# Patient Record
Sex: Male | Born: 1950
Health system: Southern US, Community
[De-identification: ages and names within clinical notes are randomized; demographics above are authoritative.]

## PROBLEM LIST (undated history)

## (undated) DIAGNOSIS — L570 Actinic keratosis: Secondary | ICD-10-CM

## (undated) DIAGNOSIS — C4491 Basal cell carcinoma of skin, unspecified: Secondary | ICD-10-CM

## (undated) DIAGNOSIS — K219 Gastro-esophageal reflux disease without esophagitis: Secondary | ICD-10-CM

## (undated) DIAGNOSIS — I480 Paroxysmal atrial fibrillation: Secondary | ICD-10-CM

## (undated) DIAGNOSIS — N200 Calculus of kidney: Secondary | ICD-10-CM

## (undated) DIAGNOSIS — E785 Hyperlipidemia, unspecified: Secondary | ICD-10-CM

## (undated) HISTORY — DX: Basal cell carcinoma of skin, unspecified: C44.91

## (undated) HISTORY — DX: Actinic keratosis: L57.0

---

## 2013-02-07 ENCOUNTER — Emergency Department: Payer: Self-pay | Admitting: Emergency Medicine

## 2013-02-07 LAB — BASIC METABOLIC PANEL
Chloride: 105 mmol/L (ref 98–107)
Co2: 29 mmol/L (ref 21–32)
Osmolality: 285 (ref 275–301)
Potassium: 4.3 mmol/L (ref 3.5–5.1)

## 2013-02-07 LAB — URINALYSIS, COMPLETE
Ketone: NEGATIVE
Leukocyte Esterase: NEGATIVE
Nitrite: NEGATIVE
Protein: 100
RBC,UR: 356 /HPF (ref 0–5)
WBC UR: 3 /HPF (ref 0–5)

## 2013-02-07 LAB — CBC
HCT: 41.9 % (ref 40.0–52.0)
MCH: 29.1 pg (ref 26.0–34.0)
MCHC: 34.5 g/dL (ref 32.0–36.0)
MCV: 84 fL (ref 80–100)
RBC: 4.96 10*6/uL (ref 4.40–5.90)
RDW: 12.9 % (ref 11.5–14.5)

## 2013-02-07 LAB — CK TOTAL AND CKMB (NOT AT ARMC): CK, Total: 77 U/L (ref 35–232)

## 2013-02-07 LAB — TROPONIN I: Troponin-I: <0.02 ng/mL

## 2015-05-28 ENCOUNTER — Inpatient Hospital Stay
Admission: EM | Admit: 2015-05-28 | Discharge: 2015-05-29 | DRG: 310 | Disposition: A | Discharge status: Home / Self Care | Payer: Medicare Other | Attending: Internal Medicine | Admitting: Internal Medicine

## 2015-05-28 ENCOUNTER — Emergency Department: Payer: Medicare Other

## 2015-05-28 ENCOUNTER — Encounter: Payer: Self-pay | Admitting: Emergency Medicine

## 2015-05-28 DIAGNOSIS — Z8249 Family history of ischemic heart disease and other diseases of the circulatory system: Secondary | ICD-10-CM

## 2015-05-28 DIAGNOSIS — I4891 Unspecified atrial fibrillation: Secondary | ICD-10-CM | POA: Diagnosis not present

## 2015-05-28 DIAGNOSIS — R079 Chest pain, unspecified: Secondary | ICD-10-CM | POA: Diagnosis present

## 2015-05-28 DIAGNOSIS — Z7982 Long term (current) use of aspirin: Secondary | ICD-10-CM | POA: Diagnosis not present

## 2015-05-28 DIAGNOSIS — E785 Hyperlipidemia, unspecified: Secondary | ICD-10-CM | POA: Diagnosis present

## 2015-05-28 DIAGNOSIS — M25512 Pain in left shoulder: Secondary | ICD-10-CM | POA: Diagnosis present

## 2015-05-28 DIAGNOSIS — Z79899 Other long term (current) drug therapy: Secondary | ICD-10-CM | POA: Diagnosis not present

## 2015-05-28 LAB — COMPREHENSIVE METABOLIC PANEL
ALBUMIN: 4.3 g/dL (ref 3.5–5.0)
ALK PHOS: 59 U/L (ref 38–126)
ALT: 40 U/L (ref 17–63)
AST: 17 U/L (ref 15–41)
Anion gap: 7 (ref 5–15)
BILIRUBIN TOTAL: 0.8 mg/dL (ref 0.3–1.2)
BUN: 22 mg/dL — AB (ref 6–20)
CALCIUM: 9.5 mg/dL (ref 8.9–10.3)
CO2: 27 mmol/L (ref 22–32)
Chloride: 105 mmol/L (ref 101–111)
Creatinine, Ser: 0.83 mg/dL (ref 0.61–1.24)
GFR calc Af Amer: >60 mL/min (ref >60)
GFR calc non Af Amer: >60 mL/min (ref >60)
GLUCOSE: 148 mg/dL — AB (ref 65–99)
Potassium: 4 mmol/L (ref 3.5–5.1)
Sodium: 139 mmol/L (ref 135–145)
TOTAL PROTEIN: 7.8 g/dL (ref 6.5–8.1)

## 2015-05-28 LAB — PROTIME-INR
INR: 0.96
Prothrombin Time: 13 seconds (ref 11.4–15.0)

## 2015-05-28 LAB — CBC
HCT: 49.5 % (ref 40.0–52.0)
HEMOGLOBIN: 16.5 g/dL (ref 13.0–18.0)
MCH: 28.4 pg (ref 26.0–34.0)
MCHC: 33.3 g/dL (ref 32.0–36.0)
MCV: 85.2 fL (ref 80.0–100.0)
PLATELETS: 188 10*3/uL (ref 150–440)
RBC: 5.8 MIL/uL (ref 4.40–5.90)
RDW: 13.2 % (ref 11.5–14.5)
WBC: 7.9 10*3/uL (ref 3.8–10.6)

## 2015-05-28 LAB — LIPID PANEL
CHOLESTEROL: 195 mg/dL (ref 0–200)
HDL: 47 mg/dL (ref >40)
LDL Cholesterol: 107 mg/dL — ABNORMAL HIGH (ref 0–99)
TRIGLYCERIDES: 205 mg/dL — AB (ref <150)
Total CHOL/HDL Ratio: 4.1 RATIO
VLDL: 41 mg/dL — ABNORMAL HIGH (ref 0–40)

## 2015-05-28 LAB — TROPONIN I
Troponin I: <0.03 ng/mL (ref <0.031)
Troponin I: 0.03 ng/mL (ref <0.031)
Troponin I: 0.03 ng/mL (ref <0.031)

## 2015-05-28 LAB — HEMOGLOBIN A1C: Hgb A1c MFr Bld: 6.1 % — ABNORMAL HIGH (ref 4.0–6.0)

## 2015-05-28 LAB — APTT: aPTT: 26 seconds (ref 24–36)

## 2015-05-28 LAB — TSH: TSH: 1.078 u[IU]/mL (ref 0.350–4.500)

## 2015-05-28 MED ORDER — DILTIAZEM HCL 25 MG/5ML IV SOLN
10.0000 mg | Freq: Once | INTRAVENOUS | Status: AC
  Administered 2015-05-28: 10 mg via INTRAVENOUS

## 2015-05-28 MED ORDER — HEPARIN SODIUM (PORCINE) 5000 UNIT/ML IJ SOLN: 4000.0000 [IU] | Freq: Once | INTRAMUSCULAR | Status: DC

## 2015-05-28 MED ORDER — DILTIAZEM HCL 25 MG/5ML IV SOLN
INTRAVENOUS | Status: AC
  Filled 2015-05-28: qty 5

## 2015-05-28 MED ORDER — DILTIAZEM LOAD VIA INFUSION
10.0000 mg | Freq: Once | INTRAVENOUS | Status: AC
  Administered 2015-05-28: 10 mg via INTRAVENOUS
  Filled 2015-05-28: qty 10

## 2015-05-28 MED ORDER — NITROGLYCERIN 0.4 MG SL SUBL
0.4000 mg | SUBLINGUAL_TABLET | SUBLINGUAL | Status: DC | PRN
Start: 2015-05-28 — End: 2015-05-29

## 2015-05-28 MED ORDER — ASPIRIN 81 MG PO CHEW
324.0000 mg | CHEWABLE_TABLET | Freq: Once | ORAL | Status: AC
Start: 2015-05-28 — End: 2015-05-28
  Administered 2015-05-28: 324 mg via ORAL
  Filled 2015-05-28: qty 4

## 2015-05-28 MED ORDER — HEPARIN (PORCINE) IN NACL 100-0.45 UNIT/ML-% IJ SOLN
1100.0000 [IU]/h | INTRAMUSCULAR | Status: DC
  Filled 2015-05-28: qty 250

## 2015-05-28 MED ORDER — SODIUM CHLORIDE 0.9 % IV SOLN
Freq: Once | INTRAVENOUS | Status: AC
  Administered 2015-05-28: 10:00:00 via INTRAVENOUS

## 2015-05-28 MED ORDER — DILTIAZEM HCL 30 MG PO TABS
60.0000 mg | ORAL_TABLET | Freq: Once | ORAL | Status: AC
  Administered 2015-05-28: 60 mg via ORAL

## 2015-05-28 MED ORDER — MELOXICAM 7.5 MG PO TABS: 15.0000 mg | ORAL_TABLET | Freq: Every day | ORAL | Status: DC

## 2015-05-28 MED ORDER — ASPIRIN EC 81 MG PO TBEC
81.0000 mg | DELAYED_RELEASE_TABLET | ORAL | Status: DC
  Filled 2015-05-28: qty 1

## 2015-05-28 MED ORDER — LORATADINE 10 MG PO TABS
10.0000 mg | ORAL_TABLET | Freq: Every day | ORAL | Status: DC
  Administered 2015-05-29: 10 mg via ORAL
  Filled 2015-05-28: qty 1

## 2015-05-28 MED ORDER — RIVAROXABAN 20 MG PO TABS
20.0000 mg | ORAL_TABLET | Freq: Once | ORAL | Status: AC
  Administered 2015-05-29: 20 mg via ORAL
  Filled 2015-05-28 (×2): qty 1

## 2015-05-28 MED ORDER — DILTIAZEM HCL 60 MG PO TABS
60.0000 mg | ORAL_TABLET | Freq: Three times a day (TID) | ORAL | Status: DC
  Administered 2015-05-28: 60 mg via ORAL
  Filled 2015-05-28 (×2): qty 2
  Filled 2015-05-28: qty 1

## 2015-05-28 MED ORDER — DILTIAZEM HCL 25 MG/5ML IV SOLN
20.0000 mg | Freq: Once | INTRAVENOUS | Status: AC
  Administered 2015-05-28: 20 mg via INTRAVENOUS
  Filled 2015-05-28: qty 5

## 2015-05-28 MED ORDER — DILTIAZEM HCL 25 MG/5ML IV SOLN
INTRAVENOUS | Status: AC
  Administered 2015-05-28: 10 mg via INTRAVENOUS
  Filled 2015-05-28: qty 5

## 2015-05-28 MED ORDER — DILTIAZEM HCL 30 MG PO TABS
30.0000 mg | ORAL_TABLET | Freq: Once | ORAL | Status: AC
  Administered 2015-05-28: 30 mg via ORAL
  Filled 2015-05-28: qty 1

## 2015-05-28 MED ORDER — RIVAROXABAN 15 MG PO TABS
15.0000 mg | ORAL_TABLET | Freq: Once | ORAL | Status: AC
  Administered 2015-05-28: 15 mg via ORAL
  Filled 2015-05-28: qty 1

## 2015-05-28 MED ORDER — HEPARIN (PORCINE) IN NACL 100-0.45 UNIT/ML-% IJ SOLN: 12.0000 [IU]/kg/h | Freq: Once | INTRAMUSCULAR | Status: DC

## 2015-05-28 MED ORDER — HEPARIN BOLUS VIA INFUSION
4000.0000 [IU] | Freq: Once | INTRAVENOUS | Status: DC
  Filled 2015-05-28: qty 4000

## 2015-05-28 MED ORDER — DEXTROSE 5 % IV SOLN
5.0000 mg/h | INTRAVENOUS | Status: DC
  Administered 2015-05-28: 10 mg/h via INTRAVENOUS
  Filled 2015-05-28 (×2): qty 100

## 2015-05-28 NOTE — Progress Notes (Signed)
HR continue to fluctuate- even after second cardizem injection and oral tablet- will start on IV drip and monitor on stepdown unit.  BP stable, Does not appear in distress. No c/o chest pain.  Critical due to need for IV rate controlling meds, discussed with family. Monitor in stepdown.  Additional critical care time spent 20 min.

## 2015-05-28 NOTE — H&P (Signed)
[facility] [place] NAME: Anthony Saunders    MR#:  [id number]  DATE OF BIRTH:  1951-07-01  DATE OF ADMISSION:  05/28/2015  PRIMARY CARE PHYSICIAN: Madelyn Brunner, MD   REQUESTING/REFERRING PHYSICIAN: Dr. Jimmye Norman  CHIEF COMPLAINT:   Chief Complaint  Patient presents with  . Irregular Heart Beat    HISTORY OF PRESENT ILLNESS: Anthony Saunders  is a 64 y.o. male with a known history of no regular medical issues, follows with his PMD regularly, and just on some pain medication for his pain in his left shoulder at home. Today when he was at work when he was trying to lift up a door and loaded up on the truck with his partner he suddenly started having palpitation in the chest, along with that he had heaviness in his chest and excessive sweating on his forehead. Concerned with this he called his daughter who is a Equities trader, she came with her blood pressure cough to check his blood pressure, which was 150/100 but the heart rate was in the 80s and 90s on the machine, when she felt his was she felt it being very irregular so she told him to come to emergency room and just brought him here. Patient denies any similar episodes in the past, he said that once in the remote past history-this was done in the arm and it was negative. In ER he was noted to have atrial fibrillation with ventricular response up to 140 and 150, given Cardizem injection 30 mg once which helped to slow it down to 90s and 100- and hospitalist team was contacted for further management. When I came to see the patient his heart rate is again fluctuating between 100-140, but patient denies any chest pain at this time after receiving nitroglycerin and aspirin tablets by ER.   PAST MEDICAL HISTORY:  History reviewed. No pertinent past medical history.  PAST SURGICAL HISTORY: History reviewed. No pertinent past surgical history.  SOCIAL HISTORY:  Social History  Substance Use  Topics  . Smoking status: Never Smoker   . Smokeless tobacco: Not on file  . Alcohol Use: No    FAMILY HISTORY:  Family History  Problem Relation Age of Onset  . CAD Father     DRUG ALLERGIES: No Known Allergies  REVIEW OF SYSTEMS:   CONSTITUTIONAL: No fever, fatigue or weakness.  EYES: No blurred or double vision.  EARS, NOSE, AND THROAT: No tinnitus or ear pain.  RESPIRATORY: No cough, shortness of breath, wheezing or hemoptysis.  CARDIOVASCULAR:  positive for chest  pain, palpitation , no  orthopnea, edema.  GASTROINTESTINAL: No nausea, vomiting, diarrhea or abdominal pain.  GENITOURINARY: No dysuria, hematuria.  ENDOCRINE: No polyuria, nocturia,  HEMATOLOGY: No anemia, easy bruising or bleeding SKIN: No rash or lesion. MUSCULOSKELETAL: No joint pain or arthritis.   NEUROLOGIC: No tingling, numbness, weakness.  PSYCHIATRY: No anxiety or depression.   MEDICATIONS AT HOME:  Prior to Admission medications   Medication Sig Start Date End Date Taking? Authorizing Provider  aspirin EC 81 MG tablet Take 81 mg by mouth every other day. At bedtime   Yes Historical Provider, MD  cetirizine (ZYRTEC) 10 MG tablet Take 10 mg by mouth at bedtime.   Yes Historical Provider, MD  meloxicam (MOBIC) 15 MG tablet Take 15 mg by mouth at bedtime.   Yes Historical Provider, MD      PHYSICAL EXAMINATION:   VITAL SIGNS: Blood pressure 136/79, pulse  68, resp. rate 20, height 5\' 6"  (1.676 m), weight 78.472 kg (173 lb), SpO2 95 %.  GENERAL:  64 y.o.-year-old patient lying in the bed with no acute distress.  EYES: Pupils equal, round, reactive to light and accommodation. No scleral icterus. Extraocular muscles intact.  HEENT: Head atraumatic, normocephalic. Oropharynx and nasopharynx clear.  NECK:  Supple, no jugular venous distention. No thyroid enlargement, no tenderness.  LUNGS: Normal breath sounds bilaterally, no wheezing, rales,rhonchi or crepitation. No use of accessory muscles of  respiration.  CARDIOVASCULAR: S1, S2 rapid and irregular . No murmurs, rubs, or gallops.  ABDOMEN: Soft, nontender, nondistended. Bowel sounds present. No organomegaly or mass.  EXTREMITIES: No pedal edema, cyanosis, or clubbing.  NEUROLOGIC: Cranial nerves II through XII are intact. Muscle strength 5/5 in all extremities. Sensation intact. Gait not checked.  PSYCHIATRIC: The patient is alert and oriented x 3.  SKIN: No obvious rash, lesion, or ulcer.   LABORATORY PANEL:   CBC  Recent Labs Lab 05/28/15 0947  WBC 7.9  HGB 16.5  HCT 49.5  PLT 188  MCV 85.2  MCH 28.4  MCHC 33.3  RDW 13.2   ------------------------------------------------------------------------------------------------------------------  Chemistries   Recent Labs Lab 05/28/15 0947  NA 139  K 4.0  CL 105  CO2 27  GLUCOSE 148*  BUN 22*  CREATININE 0.83  CALCIUM 9.5  AST 17  ALT 40  ALKPHOS 59  BILITOT 0.8   ------------------------------------------------------------------------------------------------------------------ estimated creatinine clearance is 87.5 mL/min (by C-G formula based on Cr of 0.83). ------------------------------------------------------------------------------------------------------------------  Recent Labs  05/28/15 0947  TSH 1.078     Coagulation profile  Recent Labs Lab 05/28/15 0947  INR 0.96   ------------------------------------------------------------------------------------------------------------------- No results for input(s): DDIMER in the last 72 hours. -------------------------------------------------------------------------------------------------------------------  Cardiac Enzymes  Recent Labs Lab 05/28/15 0947  TROPONINI <0.03   ------------------------------------------------------------------------------------------------------------------ Invalid input(s):  POCBNP  ---------------------------------------------------------------------------------------------------------------  Urinalysis    Component Value Date/Time   COLORURINE Yellow 02/07/2013 0142   APPEARANCEUR Hazy 02/07/2013 0142   LABSPEC 1.021 02/07/2013 0142   PHURINE 5.0 02/07/2013 0142   GLUCOSEU Negative 02/07/2013 0142   HGBUR 3+ 02/07/2013 0142   BILIRUBINUR Negative 02/07/2013 0142   KETONESUR Negative 02/07/2013 0142   PROTEINUR 100 mg/dL 02/07/2013 0142   NITRITE Negative 02/07/2013 0142   LEUKOCYTESUR Negative 02/07/2013 0142     RADIOLOGY: Dg Chest Port 1 View  05/28/2015  CLINICAL DATA:  Left-sided chest pain following moving a door, initial encounter EXAM: PORTABLE CHEST - 1 VIEW COMPARISON:  None. FINDINGS: The heart size and mediastinal contours are within normal limits. Both lungs are clear. The visualized skeletal structures are unremarkable. IMPRESSION: No active disease. Electronically Signed   By: Inez Catalina M.D.   On: 05/28/2015 10:27    EKG:   attempted fibrillation with ventricular response up to 130.  Assessment and Plan  * Atrial Fbrillation with rapid ventricular response   Given injection Cardizem 30 mg once with short lasting response.  Now again heart rate is fast, we'll give her 10 mg Cardizem injection 1 time and oral Cardizem.  Monitor 1 more hour in ER if his heart rate stays under control after that we'll send him to telemetry floor, otherwise started on Cardizem drip and we'll have to send him to stepdown unit.    I will check lipid panel, hemoglobin A1c, echocardiogram, serial troponin   Get cardiology consultation with the [facility] oncall.   TSH normal.   Cardiology to decide his need for anti-coagulation.  Given 1  dose of Xarelto by ER.  * Chest pain  This was with episode of palpitation, resolved with nitroglycerin.  He will be monitored, serial troponin and echocardiogram will be done.  * Left shoulder pain  Continue  meloxicam.   All the records are reviewed and case discussed with ED provider. Management plans discussed with the patient, family and they are in agreement.  CODE STATUS: Full    TOTAL TIME TAKING CARE OF THIS PATIENT:50 min.   Vaughan Basta M.D on 05/28/2015   Between 7am to 6pm - Pager - [phone]  After 6pm go to [email or web address] - password [email or web address] [facility]  Office  [phone]  CC: Primary care physician; Madelyn Brunner, MD   Note: This dictation was prepared with Dragon dictation along with smaller phrase technology. Any transcriptional errors that result from this process are unintentional.

## 2015-05-28 NOTE — ED Notes (Signed)
Dr. Laureen Abrahams called and notified of uncontrolled heart rate.  Admission orders to be changed and patient to be started on Cardizem drip.

## 2015-05-28 NOTE — Progress Notes (Signed)
ANTICOAGULATION CONSULT NOTE - Initial Consult  Pharmacy Consult for Heparin Drip initiation and monitoring Indication: atrial fibrillation  No Known Allergies  Patient Measurements: Height: 5\' 6"  (167.6 cm) Weight: 173 lb (78.472 kg) IBW/kg (Calculated) : 63.8   Vital Signs: BP: 118/85 mmHg (11/16 1056) Pulse Rate: 108 (11/16 1056)  Labs:  Recent Labs  05/28/15 0947  HGB 16.5  HCT 49.5  PLT 188  APTT 26  LABPROT 13.0  INR 0.96  CREATININE 0.83  TROPONINI <0.03    Estimated Creatinine Clearance: 87.5 mL/min (by C-G formula based on Cr of 0.83).   Medical History: History reviewed. No pertinent past medical history.  Medications:  Scheduled:  . diltiazem  30 mg Oral Once  . heparin  4,000 Units Intravenous Once  . Rivaroxaban  15 mg Oral Once   Infusions:    Assessment: Pharmacy consulted to initiate and monitor heparin drip for 64 yo male admitted with chest pain and rapid Afib.    Goal of Therapy:  Heparin level 0.3-0.7 units/ml Monitor platelets by anticoagulation protocol: Yes   Plan:  Will initiate patient on heparin 1100 units/ hr with initial bolus of 4000 units.   Will obtain Anti-Xa level at 1730.    Pharmacy will continue to monitor and adjust per consult.     Simpson,Michael L 05/28/2015,11:22 AM

## 2015-05-28 NOTE — ED Notes (Signed)
Patient has chest pain since lifting a door at work.  But also had heart racing.  Still has some chest pain.

## 2015-05-28 NOTE — ED Provider Notes (Signed)
[facility] Emergency Department Provider Note     Time seen: ----------------------------------------- 9:46 AM on 05/28/2015 -----------------------------------------    I have reviewed the triage vital signs and the nursing notes.   HISTORY  Chief Complaint Irregular Heart Beat    HPI Anthony Saunders is a 64 y.o. male who presents ER for chest pain that started left side this morning while moving a door then proceeded to cut wood thereafter. Patient was having chest pain that persisted, describes soreness across his chest, he was noted have fast heart rate in triage. Patient denies any fevers, chills, shortness of breath. Pain is referred in his left arm, denies any nausea or other symptoms. Patient is not felt this way before.   History reviewed. No pertinent past medical history.  There are no active problems to display for this patient.   History reviewed. No pertinent past surgical history.  Allergies Review of patient's allergies indicates no known allergies.  Social History Social History  Substance Use Topics  . Smoking status: Never Smoker   . Smokeless tobacco: None  . Alcohol Use: No    Review of Systems Constitutional: Negative for fever. Eyes: Negative for visual changes. ENT: Negative for sore throat. Cardiovascular: Positive for chest pain Respiratory: Negative for shortness of breath. Gastrointestinal: Negative for abdominal pain, vomiting and diarrhea. Genitourinary: Negative for dysuria. Musculoskeletal: Negative for back pain. Skin: Negative for rash. Neurological: Negative for headaches, focal weakness or numbness.  10-point ROS otherwise negative.  ____________________________________________   PHYSICAL EXAM:  VITAL SIGNS: ED Triage Vitals  Enc Vitals Group     BP 05/28/15 0944 153/88 mmHg     Pulse Rate 05/28/15 0944 143     Resp 05/28/15 0944 20     Temp --      Temp src --      SpO2 05/28/15 0944 99  %     Weight 05/28/15 0944 173 lb (78.472 kg)     Height 05/28/15 0944 5\' 6"  (1.676 m)     Head Cir --      Peak Flow --      Pain Score 05/28/15 0934 5     Pain Loc --      Pain Edu? --      Excl. in [place]? --     Constitutional: Alert and oriented. Well appearing and in no distress. Eyes: Conjunctivae are normal. PERRL. Normal extraocular movements. ENT   Head: Normocephalic and atraumatic.   Nose: No congestion/rhinnorhea.   Mouth/Throat: Mucous membranes are moist.   Neck: No stridor. Cardiovascular: Irregularly irregular rhythm. Normal and symmetric distal pulses are present in all extremities.  Respiratory: Normal respiratory effort without tachypnea nor retractions. Breath sounds are clear and equal bilaterally. No wheezes/rales/rhonchi. Gastrointestinal: Soft and nontender. No distention. No abdominal bruits.  Musculoskeletal: Nontender with normal range of motion in all extremities. No joint effusions.  No lower extremity tenderness nor edema. Neurologic:  Normal speech and language. No gross focal neurologic deficits are appreciated. Speech is normal. No gait instability. Skin:  Skin is warm, dry and intact. No rash noted. Psychiatric: Mood and affect are normal. Speech and behavior are normal. Patient exhibits appropriate insight and judgment. ____________________________________________  EKG: Interpreted by me. Atrial fibrillation with a rapid ventricular response. Rate is 136 bpm. Nonspecific ST and T wave changes, question will septal infarct.  ____________________________________________  ED COURSE:  Pertinent labs & imaging results that were available during my care of the patient were reviewed by me  and considered in my medical decision making (see chart for details). Patient is in no acute distress, will receive IV Cardizem for rate control. Aspirin will be given as well for his chest pain. ____________________________________________    LABS  (pertinent positives/negatives)  Labs Reviewed  COMPREHENSIVE METABOLIC PANEL - Abnormal; Notable for the following:    Glucose, Bld 148 (*)    BUN 22 (*)    All other components within normal limits  CBC  TROPONIN I  APTT  PROTIME-INR  TSH   CRITICAL CARE Performed by: Earleen Newport   Total critical care time: 30 minutes  Critical care time was exclusive of separately billable procedures and treating other patients.  Critical care was necessary to treat or prevent imminent or life-threatening deterioration.  Critical care was time spent personally by me on the following activities: development of treatment plan with patient and/or surrogate as well as nursing, discussions with consultants, evaluation of patient's response to treatment, examination of patient, obtaining history from patient or surrogate, ordering and performing treatments and interventions, ordering and review of laboratory studies, ordering and review of radiographic studies, pulse oximetry and re-evaluation of patient's condition.    RADIOLOGY Images were viewed by me  Chest x-ray  IMPRESSION: No active disease. ____________________________________________  FINAL ASSESSMENT AND PLAN  Chest pain, rapid atrial fibrillation  Plan: Patient with labs and imaging as dictated above. Patient received IV and oral Cardizem with good rate control. We'll place on a heparin drip as well due to new onset A. fib associated with chest pain. He is currently chest pain-free, stable for admission.   Earleen Newport, MD   Earleen Newport, MD 05/28/15 [phone]

## 2015-05-28 NOTE — ED Notes (Signed)
Patient is resting comfortably. Family at [facility] [place] updated

## 2015-05-28 NOTE — ED Notes (Addendum)
Dr. Boyce Medici notified of controlled heart rate since Cardizem drip started at 14:05.  Ordered received for po Cardizem to be given and if heart rate remains controlled turn drip off in one hour patient may go to 2A.

## 2015-05-28 NOTE — Consult Note (Addendum)
[place] [facility]  CARDIOLOGY CONSULT NOTE  Patient ID: Anthony Saunders MRN: [id number] DOB/AGE: 1950/12/12 64 y.o.  Admit date: 05/28/2015 Referring Physician vachhani Primary Physician walker Primary Cardiologist   Reason for Consultation afib  HPI: P with no prior caradiac history presentling with complaints of palpitaitons noted to have afib with rvr. Placed on iv cardizem with improved rate control. CHADS 2 VASc score of 1. No contraindicaiton to anticoagulation. No chest pian or etoh abuse.   ROS Review of Systems - History obtained from chart review and the patient General ROS: positive for  - palpitations Respiratory ROS: no cough, shortness of breath, or wheezing Cardiovascular ROS: positive for - palpitations Gastrointestinal ROS: no abdominal pain, change in bowel habits, or black or bloody stools Neurological ROS: no TIA or stroke symptoms   History reviewed. No pertinent past medical history.  Family History  Problem Relation Age of Onset  . CAD Father     Social History   Social History  . Marital Status: Married    Spouse Name: N/A  . Number of Children: N/A  . Years of Education: N/A   Occupational History  . Not on file.   Social History Main Topics  . Smoking status: Never Smoker   . Smokeless tobacco: Not on file  . Alcohol Use: No  . Drug Use: No  . Sexual Activity: Not on file   Other Topics Concern  . Not on file   Social History Narrative  . No narrative on file    History reviewed. No pertinent past surgical history.    (Not in a hospital admission)  Physical Exam: Blood pressure 125/74, pulse 102, resp. rate 16, height 5\' 6"  (1.676 m), weight 78.472 kg (173 lb), SpO2 97 %.   General appearance: alert and cooperative Head: Normocephalic, without obvious abnormality, atraumatic Resp: clear to auscultation bilaterally Cardio: irregularly irregular rhythm GI: soft, non-tender; bowel sounds normal; no  masses,  no organomegaly Labs:   Lab Results  Component Value Date   WBC 7.9 05/28/2015   HGB 16.5 05/28/2015   HCT 49.5 05/28/2015   MCV 85.2 05/28/2015   PLT 188 05/28/2015    Recent Labs Lab 05/28/15 0947  NA 139  K 4.0  CL 105  CO2 27  BUN 22*  CREATININE 0.83  CALCIUM 9.5  PROT 7.8  BILITOT 0.8  ALKPHOS 59  ALT 40  AST 17  GLUCOSE 148*   Lab Results  Component Value Date   CKMB < 0.5* 02/07/2013   TROPONINI 0.03 05/28/2015      Radiology: no chf EKG: afib with vaviaable vr  ASSESSMENT AND PLAN:  Pt with new onset afib. Etiology unclear. Will proceed with eho. And control rate with cardizem. Will start xarelto 20 mg daily for antiocagulation Signed: Teodoro Spray MD, [facility] 05/28/2015, 10:00 PM

## 2015-05-28 NOTE — ED Notes (Signed)
cardizem drip on delay for 1 hour.  Pt alert, watching tv.  No acute distress.

## 2015-05-28 NOTE — ED Notes (Signed)
Heart rate 144-168.  Dr Ubaldo Glassing aware.  meds given stat.  Pt eating dinner tray.  No chest pain or sob.  Skin warm and dry.  cardizem drip still on delay per dr Ubaldo Glassing.  Family at bedside.  afib on monitor.

## 2015-05-28 NOTE — ED Notes (Signed)
Pt reports chest pain left side this am while moving a door then proceeded to cut wood there after. Called his daughter whom is a Marine scientist and she advised him to come to the ed.  Pt with increased heart rate in triage denies pain.

## 2015-05-29 ENCOUNTER — Inpatient Hospital Stay
Admit: 2015-05-29 | Discharge: 2015-05-29 | Disposition: A | Discharge status: Home / Self Care | Payer: Medicare Other | Attending: Internal Medicine | Admitting: Internal Medicine

## 2015-05-29 LAB — BASIC METABOLIC PANEL
Anion gap: 5 (ref 5–15)
BUN: 24 mg/dL — AB (ref 6–20)
CALCIUM: 8.6 mg/dL — AB (ref 8.9–10.3)
CHLORIDE: 110 mmol/L (ref 101–111)
CO2: 23 mmol/L (ref 22–32)
CREATININE: 0.77 mg/dL (ref 0.61–1.24)
GFR calc non Af Amer: >60 mL/min (ref >60)
Glucose, Bld: 177 mg/dL — ABNORMAL HIGH (ref 65–99)
Potassium: 4.9 mmol/L (ref 3.5–5.1)
Sodium: 138 mmol/L (ref 135–145)

## 2015-05-29 LAB — CBC
HEMATOCRIT: 47.5 % (ref 40.0–52.0)
Hemoglobin: 15.6 g/dL (ref 13.0–18.0)
MCH: 28.3 pg (ref 26.0–34.0)
MCHC: 32.8 g/dL (ref 32.0–36.0)
MCV: 86.1 fL (ref 80.0–100.0)
Platelets: 203 10*3/uL (ref 150–440)
RBC: 5.52 MIL/uL (ref 4.40–5.90)
RDW: 13.2 % (ref 11.5–14.5)
WBC: 11 10*3/uL — ABNORMAL HIGH (ref 3.8–10.6)

## 2015-05-29 LAB — TROPONIN I

## 2015-05-29 MED ORDER — DILTIAZEM HCL ER COATED BEADS 180 MG PO CP24: 180.0000 mg | ORAL_CAPSULE | Freq: Every day | ORAL | Status: DC

## 2015-05-29 MED ORDER — RIVAROXABAN (XARELTO) VTE STARTER PACK (15 & 20 MG): ORAL_TABLET | ORAL | Status: DC

## 2015-05-29 MED ORDER — RIVAROXABAN 20 MG PO TABS: 20.0000 mg | ORAL_TABLET | Freq: Every day | ORAL | Status: DC

## 2015-05-29 NOTE — Progress Notes (Signed)
ALDON DEVENY is a 64 y.o. male patient admitted from ED awake, alert - oriented  X 4 - no acute distress noted.  VSS - Blood pressure 155/77, pulse 89, temperature 97.9 F (36.6 C), temperature source Oral, resp. rate 16, height 5\' 6"  (1.676 m), weight 76.25 kg (168 lb 1.6 oz), SpO2 98 %.    IV in place, occlusive dsg intact without redness.  Orientation to room, and floor completed with information packet given to patient/family.  Admission INP armband ID verified with patient/family, and in place.   SR up x 2, fall assessment complete, with patient and family able to verbalize understanding of risk associated with falls, and verbalized understanding to call nsg before up out of bed.  Call light within reach, patient able to voice, and demonstrate understanding.  Skin, clean-dry- intact without evidence of bruising, or skin tears.   No evidence of skin break down noted on exam. Skin assessed with Kristine Garbe, RN.   Will cont to eval and treat per MD orders.  Rachael Fee, RN 05/29/2015 12:36 AM

## 2015-05-29 NOTE — Progress Notes (Signed)
Echo results called to Dr. Bridgett Larsson, may dc pt.

## 2015-05-29 NOTE — Discharge Instructions (Signed)
Heart healthy diet. °Activity as tolerated. °

## 2015-05-29 NOTE — Care Management (Signed)
Patient to discharge on Xarelto.  He does have prescription coverage with his medicare through [place]. This medication does not require prior authorization.  Provided patient with 30 day free coupon

## 2015-05-29 NOTE — Progress Notes (Signed)
*  PRELIMINARY RESULTS* Echocardiogram 2D Echocardiogram has been performed.  Laqueta Jean Hege 05/29/2015, 1:33 PM

## 2015-05-29 NOTE — Progress Notes (Signed)
Pt discharged to home via wc.  Instructions  given to pt.  Questions answered.  No distress.  

## 2015-05-29 NOTE — Discharge Summary (Signed)
[facility] NAME: Anthony Saunders    MR#:  [id number]  DATE OF BIRTH:  September 19, 1950  DATE OF ADMISSION:  05/28/2015 ADMITTING PHYSICIAN: Vaughan Basta, MD  DATE OF DISCHARGE: 05/29/2015  2:51 PM  PRIMARY CARE PHYSICIAN: Madelyn Brunner, MD    ADMISSION DIAGNOSIS:  Chest pain, unspecified chest pain type [R07.9] Atrial fibrillation, unspecified type ([place]) [I48.91]   DISCHARGE DIAGNOSIS:  New-onset A. fib back to normal sinus rhythm  SECONDARY DIAGNOSIS:  History reviewed. No pertinent past medical history.  HOSPITAL COURSE:   * Atrial Fbrillation with rapid ventricular response  He was given injection Cardizem and oral Cardizem,  his heart rate stays under control after that and back to normal sinus rhythm. Dr. Ubaldo Glassing suggested starting Xarelto and continue Cardizem CD 180 mg by mouth daily, discontinue aspirin. echocardiogram showed normal ejection fraction at 60%, negative serial troponin. Follow-up Dr. Ubaldo Glassing as outpatient.  * Chest pain Resolved with nitroglycerin.  * Hyperlipidemia, follow-up PCP or Dr. Ubaldo Glassing.  * Left shoulder pain Discontinue meloxicam. Avoid NSAIDS.  DISCHARGE CONDITIONS:   Stable, discharged to home today.  CONSULTS OBTAINED:  Treatment Team:  Teodoro Spray, MD  DRUG ALLERGIES:  No Known Allergies  DISCHARGE MEDICATIONS:   Discharge Medication List as of 05/29/2015  2:37 PM    START taking these medications   Details  diltiazem (CARDIZEM CD) 180 MG 24 hr capsule Take 1 capsule (180 mg total) by mouth daily., Starting 05/29/2015, Until Discontinued, Print    rivaroxaban (XARELTO) 20 MG TABS tablet Take 1 tablet (20 mg total) by mouth daily with supper., Starting 05/29/2015, Until Discontinued, Print      CONTINUE these medications which have NOT CHANGED   Details  cetirizine (ZYRTEC) 10 MG tablet Take 10 mg by mouth at bedtime., Until Discontinued, Historical Med       STOP taking these medications     aspirin EC 81 MG tablet      meloxicam (MOBIC) 15 MG tablet          DISCHARGE INSTRUCTIONS:   If you experience worsening of your admission symptoms, develop shortness of breath, life threatening emergency, suicidal or homicidal thoughts you must seek medical attention immediately by calling 911 or calling your MD immediately  if symptoms less severe.  You Must read complete instructions/literature along with all the possible adverse reactions/side effects for all the Medicines you take and that have been prescribed to you. Take any new Medicines after you have completely understood and accept all the possible adverse reactions/side effects.   Please note  You were cared for by a hospitalist during your hospital stay. If you have any questions about your discharge medications or the care you received while you were in the hospital after you are discharged, you can call the unit and asked to speak with the hospitalist on call if the hospitalist that took care of you is not available. Once you are discharged, your primary care physician will handle any further medical issues. Please note that NO REFILLS for any discharge medications will be authorized once you are discharged, as it is imperative that you return to your primary care physician (or establish a relationship with a primary care physician if you do not have one) for your aftercare needs so that they can reassess your need for medications and monitor your lab values.    Today   SUBJECTIVE   No complaint.   VITAL SIGNS:  Blood pressure 133/63, pulse 65, temperature 97.7 F (36.5 C), temperature source Oral, resp. rate 14, height 5\' 6"  (1.676 m), weight 76.25 kg (168 lb 1.6 oz), SpO2 97 %.  I/O:   Intake/Output Summary (Last 24 hours) at 05/29/15 1728 Last data filed at 05/29/15 0900  Gross per 24 hour  Intake    240 ml  Output    750 ml  Net   -510 ml    PHYSICAL EXAMINATION:   GENERAL:  64 y.o.-year-old patient lying in the bed with no acute distress.  EYES: Pupils equal, round, reactive to light and accommodation. No scleral icterus. Extraocular muscles intact.  HEENT: Head atraumatic, normocephalic. Oropharynx and nasopharynx clear.  NECK:  Supple, no jugular venous distention. No thyroid enlargement, no tenderness.  LUNGS: Normal breath sounds bilaterally, no wheezing, rales,rhonchi or crepitation. No use of accessory muscles of respiration.  CARDIOVASCULAR: S1, S2 normal. No murmurs, rubs, or gallops.  ABDOMEN: Soft, non-tender, non-distended. Bowel sounds present. No organomegaly or mass.  EXTREMITIES: No pedal edema, cyanosis, or clubbing.  NEUROLOGIC: Cranial nerves II through XII are intact. Muscle strength 5/5 in all extremities. Sensation intact. Gait not checked.  PSYCHIATRIC: The patient is alert and oriented x 3.  SKIN: No obvious rash, lesion, or ulcer.   DATA REVIEW:   CBC  Recent Labs Lab 05/29/15 0512  WBC 11.0*  HGB 15.6  HCT 47.5  PLT 203    Chemistries   Recent Labs Lab 05/28/15 0947 05/29/15 0512  NA 139 138  K 4.0 4.9  CL 105 110  CO2 27 23  GLUCOSE 148* 177*  BUN 22* 24*  CREATININE 0.83 0.77  CALCIUM 9.5 8.6*  AST 17  --   ALT 40  --   ALKPHOS 59  --   BILITOT 0.8  --     Cardiac Enzymes  Recent Labs Lab 05/29/15 0059  TROPONINI <0.03    Microbiology Results  No results found for this or any previous visit.  RADIOLOGY:  Dg Chest Port 1 View  05/28/2015  CLINICAL DATA:  Left-sided chest pain following moving a door, initial encounter EXAM: PORTABLE CHEST - 1 VIEW COMPARISON:  None. FINDINGS: The heart size and mediastinal contours are within normal limits. Both lungs are clear. The visualized skeletal structures are unremarkable. IMPRESSION: No active disease. Electronically Signed   By: Inez Catalina M.D.   On: 05/28/2015 10:27       Discussed with Dr. Ubaldo Glassing. Management plans discussed with the  patient, his wife and they are in agreement.  CODE STATUS:     Code Status Orders        Start     Ordered   05/28/15 2358  Full code   Continuous     05/28/15 2358      TOTAL TIME TAKING CARE OF THIS PATIENT: 36 minutes.    Demetrios Loll M.D on 05/29/2015 at 5:28 PM  Between 7am to 6pm - Pager - [phone]  After 6pm go to [email or web address] - password [email or web address] [facility]  Office  [phone]  CC: Primary care physician; Madelyn Brunner, MD

## 2015-09-17 DIAGNOSIS — R739 Hyperglycemia, unspecified: Secondary | ICD-10-CM | POA: Diagnosis not present

## 2015-09-23 DIAGNOSIS — R739 Hyperglycemia, unspecified: Secondary | ICD-10-CM | POA: Diagnosis not present

## 2016-02-02 DIAGNOSIS — E7800 Pure hypercholesterolemia, unspecified: Secondary | ICD-10-CM | POA: Diagnosis not present

## 2016-02-02 DIAGNOSIS — Z125 Encounter for screening for malignant neoplasm of prostate: Secondary | ICD-10-CM | POA: Diagnosis not present

## 2016-02-02 DIAGNOSIS — R7301 Impaired fasting glucose: Secondary | ICD-10-CM | POA: Diagnosis not present

## 2016-02-16 DIAGNOSIS — E7800 Pure hypercholesterolemia, unspecified: Secondary | ICD-10-CM | POA: Diagnosis not present

## 2016-02-16 DIAGNOSIS — Z8679 Personal history of other diseases of the circulatory system: Secondary | ICD-10-CM | POA: Diagnosis not present

## 2016-02-16 DIAGNOSIS — R739 Hyperglycemia, unspecified: Secondary | ICD-10-CM | POA: Diagnosis not present

## 2016-02-16 DIAGNOSIS — Z0001 Encounter for general adult medical examination with abnormal findings: Secondary | ICD-10-CM | POA: Diagnosis not present

## 2016-02-16 DIAGNOSIS — R030 Elevated blood-pressure reading, without diagnosis of hypertension: Secondary | ICD-10-CM | POA: Diagnosis not present

## 2016-04-02 DIAGNOSIS — R030 Elevated blood-pressure reading, without diagnosis of hypertension: Secondary | ICD-10-CM | POA: Diagnosis not present

## 2016-04-02 DIAGNOSIS — E784 Other hyperlipidemia: Secondary | ICD-10-CM | POA: Diagnosis not present

## 2016-04-02 DIAGNOSIS — I480 Paroxysmal atrial fibrillation: Secondary | ICD-10-CM | POA: Diagnosis not present

## 2016-04-02 DIAGNOSIS — R739 Hyperglycemia, unspecified: Secondary | ICD-10-CM | POA: Diagnosis not present

## 2016-04-02 DIAGNOSIS — D72818 Other decreased white blood cell count: Secondary | ICD-10-CM | POA: Diagnosis not present

## 2016-04-02 DIAGNOSIS — N200 Calculus of kidney: Secondary | ICD-10-CM | POA: Diagnosis not present

## 2016-05-28 DIAGNOSIS — D3131 Benign neoplasm of right choroid: Secondary | ICD-10-CM | POA: Diagnosis not present

## 2016-06-24 DIAGNOSIS — L812 Freckles: Secondary | ICD-10-CM | POA: Diagnosis not present

## 2016-06-24 DIAGNOSIS — R238 Other skin changes: Secondary | ICD-10-CM | POA: Diagnosis not present

## 2016-06-24 DIAGNOSIS — L821 Other seborrheic keratosis: Secondary | ICD-10-CM | POA: Diagnosis not present

## 2016-06-24 DIAGNOSIS — L570 Actinic keratosis: Secondary | ICD-10-CM | POA: Diagnosis not present

## 2016-06-24 DIAGNOSIS — Z1283 Encounter for screening for malignant neoplasm of skin: Secondary | ICD-10-CM | POA: Diagnosis not present

## 2016-06-24 DIAGNOSIS — L218 Other seborrheic dermatitis: Secondary | ICD-10-CM | POA: Diagnosis not present

## 2016-06-24 DIAGNOSIS — Z09 Encounter for follow-up examination after completed treatment for conditions other than malignant neoplasm: Secondary | ICD-10-CM | POA: Diagnosis not present

## 2016-06-24 DIAGNOSIS — D1801 Hemangioma of skin and subcutaneous tissue: Secondary | ICD-10-CM | POA: Diagnosis not present

## 2016-06-24 DIAGNOSIS — D225 Melanocytic nevi of trunk: Secondary | ICD-10-CM | POA: Diagnosis not present

## 2016-06-24 DIAGNOSIS — B353 Tinea pedis: Secondary | ICD-10-CM | POA: Diagnosis not present

## 2016-09-21 DIAGNOSIS — D72818 Other decreased white blood cell count: Secondary | ICD-10-CM | POA: Diagnosis not present

## 2016-09-21 DIAGNOSIS — E784 Other hyperlipidemia: Secondary | ICD-10-CM | POA: Diagnosis not present

## 2016-09-21 DIAGNOSIS — R739 Hyperglycemia, unspecified: Secondary | ICD-10-CM | POA: Diagnosis not present

## 2016-09-28 DIAGNOSIS — D72818 Other decreased white blood cell count: Secondary | ICD-10-CM | POA: Diagnosis not present

## 2016-09-28 DIAGNOSIS — R030 Elevated blood-pressure reading, without diagnosis of hypertension: Secondary | ICD-10-CM | POA: Diagnosis not present

## 2016-09-28 DIAGNOSIS — Z125 Encounter for screening for malignant neoplasm of prostate: Secondary | ICD-10-CM | POA: Diagnosis not present

## 2016-09-28 DIAGNOSIS — I480 Paroxysmal atrial fibrillation: Secondary | ICD-10-CM | POA: Diagnosis not present

## 2016-09-28 DIAGNOSIS — E784 Other hyperlipidemia: Secondary | ICD-10-CM | POA: Diagnosis not present

## 2016-09-28 DIAGNOSIS — R739 Hyperglycemia, unspecified: Secondary | ICD-10-CM | POA: Diagnosis not present

## 2016-12-02 DIAGNOSIS — L298 Other pruritus: Secondary | ICD-10-CM | POA: Diagnosis not present

## 2016-12-02 DIAGNOSIS — Z85828 Personal history of other malignant neoplasm of skin: Secondary | ICD-10-CM | POA: Diagnosis not present

## 2016-12-02 DIAGNOSIS — L570 Actinic keratosis: Secondary | ICD-10-CM | POA: Diagnosis not present

## 2016-12-02 DIAGNOSIS — L237 Allergic contact dermatitis due to plants, except food: Secondary | ICD-10-CM | POA: Diagnosis not present

## 2017-01-11 DIAGNOSIS — L2389 Allergic contact dermatitis due to other agents: Secondary | ICD-10-CM | POA: Diagnosis not present

## 2017-01-25 DIAGNOSIS — L2389 Allergic contact dermatitis due to other agents: Secondary | ICD-10-CM | POA: Diagnosis not present

## 2017-04-20 DIAGNOSIS — D72818 Other decreased white blood cell count: Secondary | ICD-10-CM | POA: Diagnosis not present

## 2017-04-20 DIAGNOSIS — Z125 Encounter for screening for malignant neoplasm of prostate: Secondary | ICD-10-CM | POA: Diagnosis not present

## 2017-04-20 DIAGNOSIS — R030 Elevated blood-pressure reading, without diagnosis of hypertension: Secondary | ICD-10-CM | POA: Diagnosis not present

## 2017-04-20 DIAGNOSIS — R739 Hyperglycemia, unspecified: Secondary | ICD-10-CM | POA: Diagnosis not present

## 2017-04-20 DIAGNOSIS — I480 Paroxysmal atrial fibrillation: Secondary | ICD-10-CM | POA: Diagnosis not present

## 2017-04-27 DIAGNOSIS — R202 Paresthesia of skin: Secondary | ICD-10-CM | POA: Diagnosis not present

## 2017-04-27 DIAGNOSIS — R739 Hyperglycemia, unspecified: Secondary | ICD-10-CM | POA: Diagnosis not present

## 2017-04-27 DIAGNOSIS — R030 Elevated blood-pressure reading, without diagnosis of hypertension: Secondary | ICD-10-CM | POA: Diagnosis not present

## 2017-04-27 DIAGNOSIS — D72818 Other decreased white blood cell count: Secondary | ICD-10-CM | POA: Diagnosis not present

## 2017-04-27 DIAGNOSIS — I480 Paroxysmal atrial fibrillation: Secondary | ICD-10-CM | POA: Diagnosis not present

## 2017-04-27 DIAGNOSIS — Z0000 Encounter for general adult medical examination without abnormal findings: Secondary | ICD-10-CM | POA: Diagnosis not present

## 2017-04-27 DIAGNOSIS — E7849 Other hyperlipidemia: Secondary | ICD-10-CM | POA: Diagnosis not present

## 2017-06-30 DIAGNOSIS — L570 Actinic keratosis: Secondary | ICD-10-CM | POA: Diagnosis not present

## 2017-06-30 DIAGNOSIS — D485 Neoplasm of uncertain behavior of skin: Secondary | ICD-10-CM | POA: Diagnosis not present

## 2017-06-30 DIAGNOSIS — L578 Other skin changes due to chronic exposure to nonionizing radiation: Secondary | ICD-10-CM | POA: Diagnosis not present

## 2017-06-30 DIAGNOSIS — L2089 Other atopic dermatitis: Secondary | ICD-10-CM | POA: Diagnosis not present

## 2017-07-13 DIAGNOSIS — D3131 Benign neoplasm of right choroid: Secondary | ICD-10-CM | POA: Diagnosis not present

## 2017-07-19 DIAGNOSIS — D485 Neoplasm of uncertain behavior of skin: Secondary | ICD-10-CM | POA: Diagnosis not present

## 2017-10-19 DIAGNOSIS — R739 Hyperglycemia, unspecified: Secondary | ICD-10-CM | POA: Diagnosis not present

## 2017-10-19 DIAGNOSIS — E785 Hyperlipidemia, unspecified: Secondary | ICD-10-CM | POA: Diagnosis not present

## 2017-10-19 DIAGNOSIS — E7849 Other hyperlipidemia: Secondary | ICD-10-CM | POA: Diagnosis not present

## 2017-10-19 DIAGNOSIS — R202 Paresthesia of skin: Secondary | ICD-10-CM | POA: Diagnosis not present

## 2017-10-26 DIAGNOSIS — I480 Paroxysmal atrial fibrillation: Secondary | ICD-10-CM | POA: Diagnosis not present

## 2017-10-26 DIAGNOSIS — R739 Hyperglycemia, unspecified: Secondary | ICD-10-CM | POA: Diagnosis not present

## 2017-10-26 DIAGNOSIS — Z125 Encounter for screening for malignant neoplasm of prostate: Secondary | ICD-10-CM | POA: Diagnosis not present

## 2017-10-26 DIAGNOSIS — N200 Calculus of kidney: Secondary | ICD-10-CM | POA: Diagnosis not present

## 2017-10-26 DIAGNOSIS — E538 Deficiency of other specified B group vitamins: Secondary | ICD-10-CM | POA: Diagnosis not present

## 2017-10-26 DIAGNOSIS — R0789 Other chest pain: Secondary | ICD-10-CM | POA: Diagnosis not present

## 2017-10-26 DIAGNOSIS — R030 Elevated blood-pressure reading, without diagnosis of hypertension: Secondary | ICD-10-CM | POA: Diagnosis not present

## 2017-10-26 DIAGNOSIS — E7849 Other hyperlipidemia: Secondary | ICD-10-CM | POA: Diagnosis not present

## 2017-10-26 DIAGNOSIS — D72818 Other decreased white blood cell count: Secondary | ICD-10-CM | POA: Diagnosis not present

## 2017-11-10 DIAGNOSIS — R0789 Other chest pain: Secondary | ICD-10-CM | POA: Diagnosis not present

## 2018-01-17 DIAGNOSIS — Z872 Personal history of diseases of the skin and subcutaneous tissue: Secondary | ICD-10-CM | POA: Diagnosis not present

## 2018-01-17 DIAGNOSIS — L578 Other skin changes due to chronic exposure to nonionizing radiation: Secondary | ICD-10-CM | POA: Diagnosis not present

## 2018-01-17 DIAGNOSIS — Z1283 Encounter for screening for malignant neoplasm of skin: Secondary | ICD-10-CM | POA: Diagnosis not present

## 2018-01-17 DIAGNOSIS — Z86018 Personal history of other benign neoplasm: Secondary | ICD-10-CM | POA: Diagnosis not present

## 2018-04-03 DIAGNOSIS — H3530 Unspecified macular degeneration: Secondary | ICD-10-CM | POA: Diagnosis not present

## 2018-04-11 DIAGNOSIS — H35369 Drusen (degenerative) of macula, unspecified eye: Secondary | ICD-10-CM | POA: Diagnosis not present

## 2018-04-21 DIAGNOSIS — E7849 Other hyperlipidemia: Secondary | ICD-10-CM | POA: Diagnosis not present

## 2018-04-21 DIAGNOSIS — Z125 Encounter for screening for malignant neoplasm of prostate: Secondary | ICD-10-CM | POA: Diagnosis not present

## 2018-04-21 DIAGNOSIS — R739 Hyperglycemia, unspecified: Secondary | ICD-10-CM | POA: Diagnosis not present

## 2018-04-21 DIAGNOSIS — R030 Elevated blood-pressure reading, without diagnosis of hypertension: Secondary | ICD-10-CM | POA: Diagnosis not present

## 2018-04-21 DIAGNOSIS — E538 Deficiency of other specified B group vitamins: Secondary | ICD-10-CM | POA: Diagnosis not present

## 2018-07-07 ENCOUNTER — Other Ambulatory Visit: Payer: Self-pay

## 2018-07-07 ENCOUNTER — Emergency Department: Payer: PPO

## 2018-07-07 ENCOUNTER — Emergency Department
Admission: EM | Admit: 2018-07-07 | Discharge: 2018-07-07 | Disposition: A | Discharge status: Home / Self Care | Payer: PPO | Attending: Emergency Medicine | Admitting: Emergency Medicine

## 2018-07-07 DIAGNOSIS — Z7901 Long term (current) use of anticoagulants: Secondary | ICD-10-CM | POA: Insufficient documentation

## 2018-07-07 DIAGNOSIS — Z79899 Other long term (current) drug therapy: Secondary | ICD-10-CM | POA: Insufficient documentation

## 2018-07-07 DIAGNOSIS — R109 Unspecified abdominal pain: Secondary | ICD-10-CM | POA: Diagnosis not present

## 2018-07-07 DIAGNOSIS — N200 Calculus of kidney: Secondary | ICD-10-CM

## 2018-07-07 DIAGNOSIS — N132 Hydronephrosis with renal and ureteral calculous obstruction: Secondary | ICD-10-CM | POA: Diagnosis not present

## 2018-07-07 LAB — COMPREHENSIVE METABOLIC PANEL
ALK PHOS: 52 U/L (ref 38–126)
ALT: 34 U/L (ref 0–44)
AST: 21 U/L (ref 15–41)
Albumin: 4.4 g/dL (ref 3.5–5.0)
Anion gap: 8 (ref 5–15)
BUN: 24 mg/dL — ABNORMAL HIGH (ref 8–23)
CALCIUM: 8.9 mg/dL (ref 8.9–10.3)
CO2: 23 mmol/L (ref 22–32)
Chloride: 107 mmol/L (ref 98–111)
Creatinine, Ser: 0.99 mg/dL (ref 0.61–1.24)
GFR calc Af Amer: >60 mL/min (ref >60)
GFR calc non Af Amer: >60 mL/min (ref >60)
Glucose, Bld: 162 mg/dL — ABNORMAL HIGH (ref 70–99)
Potassium: 4.3 mmol/L (ref 3.5–5.1)
Sodium: 138 mmol/L (ref 135–145)
Total Bilirubin: 0.6 mg/dL (ref 0.3–1.2)
Total Protein: 7.3 g/dL (ref 6.5–8.1)

## 2018-07-07 LAB — CBC
HCT: 44.5 % (ref 39.0–52.0)
HEMOGLOBIN: 14.8 g/dL (ref 13.0–17.0)
MCH: 28.6 pg (ref 26.0–34.0)
MCHC: 33.3 g/dL (ref 30.0–36.0)
MCV: 85.9 fL (ref 80.0–100.0)
Platelets: 189 10*3/uL (ref 150–400)
RBC: 5.18 MIL/uL (ref 4.22–5.81)
RDW: 12.6 % (ref 11.5–15.5)
WBC: 7.3 10*3/uL (ref 4.0–10.5)
nRBC: 0 % (ref 0.0–0.2)

## 2018-07-07 LAB — URINALYSIS, COMPLETE (UACMP) WITH MICROSCOPIC
Bilirubin Urine: NEGATIVE
GLUCOSE, UA: NEGATIVE mg/dL
Ketones, ur: NEGATIVE mg/dL
Leukocytes, UA: NEGATIVE
Nitrite: NEGATIVE
Protein, ur: 30 mg/dL — AB
RBC / HPF: >50 RBC/hpf — ABNORMAL HIGH (ref 0–5)
Specific Gravity, Urine: 1.025 (ref 1.005–1.030)
pH: 5 (ref 5.0–8.0)

## 2018-07-07 MED ORDER — SODIUM CHLORIDE 0.9 % IV BOLUS
1000.0000 mL | Freq: Once | INTRAVENOUS | Status: AC
  Administered 2018-07-07: 1000 mL via INTRAVENOUS

## 2018-07-07 MED ORDER — OXYCODONE-ACETAMINOPHEN 5-325 MG PO TABS: 1.0000 | ORAL_TABLET | ORAL | 0 refills | Status: DC | PRN

## 2018-07-07 MED ORDER — TAMSULOSIN HCL 0.4 MG PO CAPS: 0.4000 mg | ORAL_CAPSULE | Freq: Every day | ORAL | 0 refills | Status: DC

## 2018-07-07 MED ORDER — KETOROLAC TROMETHAMINE 30 MG/ML IJ SOLN
15.0000 mg | Freq: Once | INTRAMUSCULAR | Status: AC
  Administered 2018-07-07: 15 mg via INTRAVENOUS
  Filled 2018-07-07: qty 1

## 2018-07-07 MED ORDER — MORPHINE SULFATE (PF) 4 MG/ML IV SOLN
4.0000 mg | Freq: Once | INTRAVENOUS | Status: AC
  Administered 2018-07-07: 4 mg via INTRAVENOUS
  Filled 2018-07-07: qty 1

## 2018-07-07 MED ORDER — ONDANSETRON HCL 4 MG/2ML IJ SOLN
4.0000 mg | Freq: Once | INTRAMUSCULAR | Status: AC
  Administered 2018-07-07: 4 mg via INTRAVENOUS
  Filled 2018-07-07: qty 2

## 2018-07-07 MED ORDER — ONDANSETRON 4 MG PO TBDP: 4.0000 mg | ORAL_TABLET | Freq: Three times a day (TID) | ORAL | 0 refills | Status: DC | PRN

## 2018-07-07 NOTE — ED Notes (Signed)
Patient transported to CT 

## 2018-07-07 NOTE — ED Triage Notes (Signed)
Pt states that he started having rt sided low back pain and pain radiates to the rlq, pt reports pain severe and caused him to vomit, pt reports he believes its a stone due to having them in the past

## 2018-07-07 NOTE — ED Provider Notes (Signed)
[facility] Emergency Department Provider Note  Time seen: 10:29 AM  I have reviewed the triage vital signs and the nursing notes.   HISTORY  Chief Complaint Abdominal Pain and Flank Pain    HPI Anthony Saunders is a 67 y.o. male with a past medical history of atrial fibrillation, kidney stones, presents to the emergency department for right flank pain.  According to the patient at 7:00 this morning he developed acute onset right flank pain, 10 out of 10 in severity sharp.  States a history of kidney stones 4 or 5 in the past, states feels identical to prior kidney stones.  Denies any dysuria or hematuria.  States nausea vomiting this morning.  Denies diarrhea denies fever.  Largely negative review of systems otherwise.   No past medical history on file.  Patient Active Problem List   Diagnosis Date Noted  . Atrial fibrillation with RVR ([place]) 05/28/2015  . Atrial fibrillation with rapid ventricular response ([place]) 05/28/2015    No past surgical history on file.  Prior to Admission medications   Medication Sig Start Date End Date Taking? Authorizing Provider  cetirizine (ZYRTEC) 10 MG tablet Take 10 mg by mouth at bedtime.    [provider]  diltiazem (CARDIZEM CD) 180 MG 24 hr capsule Take 1 capsule (180 mg total) by mouth daily. 05/29/15   Demetrios Loll, MD  rivaroxaban (XARELTO) 20 MG TABS tablet Take 1 tablet (20 mg total) by mouth daily with supper. 05/29/15   Demetrios Loll, MD    Allergies  Allergen Reactions  . Meloxicam Palpitations    Family History  Problem Relation Age of Onset  . CAD Father     Social History Social History   Tobacco Use  . Smoking status: Never Smoker  Substance Use Topics  . Alcohol use: No  . Drug use: No    Review of Systems Constitutional: Negative for fever. Cardiovascular: Negative for chest pain. Respiratory: Negative for shortness of breath. Gastrointestinal: Right flank pain.  Positive for nausea  and vomiting Genitourinary: Negative for dysuria or hematuria Musculoskeletal: Negative for musculoskeletal complaints Skin: Negative for skin complaints  Neurological: Negative for headache All other ROS negative  ____________________________________________   PHYSICAL EXAM:  VITAL SIGNS: ED Triage Vitals [07/07/18 1014]  Enc Vitals Group     BP (!) 159/65     Pulse Rate (!) 53     Resp 20     Temp 98.2 F (36.8 C)     Temp Source Oral     SpO2 99 %     Weight 170 lb (77.1 kg)     Height 5\' 6"  (1.676 m)     Head Circumference      Peak Flow      Pain Score 9     Pain Loc      Pain Edu?      Excl. in [place]?    Constitutional: Alert and oriented. Well appearing and in no distress. Eyes: Normal exam ENT   Head: Normocephalic and atraumatic.   Mouth/Throat: Mucous membranes are moist. Cardiovascular: Normal rate, regular rhythm.  Respiratory: Normal respiratory effort without tachypnea nor retractions. Breath sounds are clear  Gastrointestinal: Soft and nontender. No distention.  Mild right CVA tenderness. Musculoskeletal: Nontender with normal range of motion in all extremities. Neurologic:  Normal speech and language. No gross focal neurologic deficits Skin:  Skin is warm, dry and intact.  Psychiatric: Mood and affect are normal. Speech and behavior are normal.  ____________________________________________    RADIOLOGY  CT scan consistent with 4 x 5 mm right distal ureteral stone.  ____________________________________________   INITIAL IMPRESSION / ASSESSMENT AND PLAN / ED COURSE  Pertinent labs & imaging results that were available during my care of the patient were reviewed by me and considered in my medical decision making (see chart for details).  Patient presents to the emergency department for right flank pain since 7:00 this morning.  Differential would include ureterolithiasis, pyelonephritis, urinary tract infection, colitis or diverticulitis.   Given the patient's history and description of pain highly suspect ureterolithiasis.  We will obtain CT renal scan to further evaluate, treat pain and nausea, IV hydrate while awaiting lab results.  Patient does state a meloxicam allergy but states he takes ibuprofen/NSAIDs at home without issue.  Describes allergy as it made him go into A. fib.  CT consistent with right distal ureteral stone.  Work-up otherwise nonrevealing.  We will discharge with Percocet, Flomax, Zofran.  Patient has a urine strainer at home.  We will follow-up with urology.  ____________________________________________   FINAL CLINICAL IMPRESSION(S) / ED DIAGNOSES  Right flank pain Kidney stone   Harvest Dark, MD 07/07/18 1250

## 2018-07-08 LAB — URINE CULTURE: Culture: NO GROWTH

## 2018-08-17 DIAGNOSIS — N200 Calculus of kidney: Secondary | ICD-10-CM | POA: Diagnosis not present

## 2018-08-17 DIAGNOSIS — R739 Hyperglycemia, unspecified: Secondary | ICD-10-CM | POA: Diagnosis not present

## 2018-08-17 DIAGNOSIS — I480 Paroxysmal atrial fibrillation: Secondary | ICD-10-CM | POA: Diagnosis not present

## 2018-08-17 DIAGNOSIS — E7849 Other hyperlipidemia: Secondary | ICD-10-CM | POA: Diagnosis not present

## 2018-08-17 DIAGNOSIS — Z0000 Encounter for general adult medical examination without abnormal findings: Secondary | ICD-10-CM | POA: Diagnosis not present

## 2018-08-17 DIAGNOSIS — E538 Deficiency of other specified B group vitamins: Secondary | ICD-10-CM | POA: Diagnosis not present

## 2018-08-17 DIAGNOSIS — R030 Elevated blood-pressure reading, without diagnosis of hypertension: Secondary | ICD-10-CM | POA: Diagnosis not present

## 2018-08-17 DIAGNOSIS — I700 Atherosclerosis of aorta: Secondary | ICD-10-CM | POA: Diagnosis not present

## 2018-08-17 DIAGNOSIS — D72818 Other decreased white blood cell count: Secondary | ICD-10-CM | POA: Diagnosis not present

## 2018-08-17 DIAGNOSIS — N1330 Unspecified hydronephrosis: Secondary | ICD-10-CM | POA: Diagnosis not present

## 2018-08-18 ENCOUNTER — Other Ambulatory Visit: Payer: Self-pay | Admitting: Internal Medicine

## 2018-08-18 DIAGNOSIS — N1330 Unspecified hydronephrosis: Secondary | ICD-10-CM

## 2018-08-18 DIAGNOSIS — N200 Calculus of kidney: Secondary | ICD-10-CM

## 2018-08-22 ENCOUNTER — Ambulatory Visit
Admission: RE | Admit: 2018-08-22 | Discharge: 2018-08-22 | Disposition: A | Discharge status: Home / Self Care | Payer: PPO | Source: Ambulatory Visit | Attending: Internal Medicine | Admitting: Internal Medicine

## 2018-08-22 DIAGNOSIS — N1330 Unspecified hydronephrosis: Secondary | ICD-10-CM | POA: Insufficient documentation

## 2018-08-22 DIAGNOSIS — N200 Calculus of kidney: Secondary | ICD-10-CM | POA: Diagnosis not present

## 2018-08-22 DIAGNOSIS — N132 Hydronephrosis with renal and ureteral calculous obstruction: Secondary | ICD-10-CM | POA: Diagnosis not present

## 2018-08-22 LAB — POCT I-STAT CREATININE: Creatinine, Ser: 0.7 mg/dL (ref 0.61–1.24)

## 2018-08-22 MED ORDER — IOPAMIDOL (ISOVUE-300) INJECTION 61%
100.0000 mL | Freq: Once | INTRAVENOUS | Status: AC | PRN
  Administered 2018-08-22: 100 mL via INTRAVENOUS

## 2019-01-22 DIAGNOSIS — D225 Melanocytic nevi of trunk: Secondary | ICD-10-CM | POA: Diagnosis not present

## 2019-01-22 DIAGNOSIS — L570 Actinic keratosis: Secondary | ICD-10-CM | POA: Diagnosis not present

## 2019-01-22 DIAGNOSIS — L218 Other seborrheic dermatitis: Secondary | ICD-10-CM | POA: Diagnosis not present

## 2019-01-22 DIAGNOSIS — L821 Other seborrheic keratosis: Secondary | ICD-10-CM | POA: Diagnosis not present

## 2019-01-22 DIAGNOSIS — L812 Freckles: Secondary | ICD-10-CM | POA: Diagnosis not present

## 2019-01-22 DIAGNOSIS — B353 Tinea pedis: Secondary | ICD-10-CM | POA: Diagnosis not present

## 2019-01-22 DIAGNOSIS — Z1283 Encounter for screening for malignant neoplasm of skin: Secondary | ICD-10-CM | POA: Diagnosis not present

## 2019-01-22 DIAGNOSIS — L298 Other pruritus: Secondary | ICD-10-CM | POA: Diagnosis not present

## 2019-01-22 DIAGNOSIS — D1801 Hemangioma of skin and subcutaneous tissue: Secondary | ICD-10-CM | POA: Diagnosis not present

## 2019-01-22 DIAGNOSIS — Z85828 Personal history of other malignant neoplasm of skin: Secondary | ICD-10-CM | POA: Diagnosis not present

## 2019-02-14 DIAGNOSIS — E7849 Other hyperlipidemia: Secondary | ICD-10-CM | POA: Diagnosis not present

## 2019-02-14 DIAGNOSIS — R739 Hyperglycemia, unspecified: Secondary | ICD-10-CM | POA: Diagnosis not present

## 2019-02-14 DIAGNOSIS — E538 Deficiency of other specified B group vitamins: Secondary | ICD-10-CM | POA: Diagnosis not present

## 2019-02-14 DIAGNOSIS — I480 Paroxysmal atrial fibrillation: Secondary | ICD-10-CM | POA: Diagnosis not present

## 2019-02-21 DIAGNOSIS — R030 Elevated blood-pressure reading, without diagnosis of hypertension: Secondary | ICD-10-CM | POA: Diagnosis not present

## 2019-02-21 DIAGNOSIS — R739 Hyperglycemia, unspecified: Secondary | ICD-10-CM | POA: Diagnosis not present

## 2019-02-21 DIAGNOSIS — Z125 Encounter for screening for malignant neoplasm of prostate: Secondary | ICD-10-CM | POA: Diagnosis not present

## 2019-02-21 DIAGNOSIS — E7849 Other hyperlipidemia: Secondary | ICD-10-CM | POA: Diagnosis not present

## 2019-02-21 DIAGNOSIS — D72818 Other decreased white blood cell count: Secondary | ICD-10-CM | POA: Diagnosis not present

## 2019-02-21 DIAGNOSIS — I700 Atherosclerosis of aorta: Secondary | ICD-10-CM | POA: Diagnosis not present

## 2019-02-21 DIAGNOSIS — E538 Deficiency of other specified B group vitamins: Secondary | ICD-10-CM | POA: Diagnosis not present

## 2019-02-21 DIAGNOSIS — I480 Paroxysmal atrial fibrillation: Secondary | ICD-10-CM | POA: Diagnosis not present

## 2019-06-27 DIAGNOSIS — L570 Actinic keratosis: Secondary | ICD-10-CM | POA: Diagnosis not present

## 2019-06-27 DIAGNOSIS — Z85828 Personal history of other malignant neoplasm of skin: Secondary | ICD-10-CM | POA: Diagnosis not present

## 2019-06-27 DIAGNOSIS — L578 Other skin changes due to chronic exposure to nonionizing radiation: Secondary | ICD-10-CM | POA: Diagnosis not present

## 2019-06-27 DIAGNOSIS — D1801 Hemangioma of skin and subcutaneous tissue: Secondary | ICD-10-CM | POA: Diagnosis not present

## 2019-06-27 DIAGNOSIS — L821 Other seborrheic keratosis: Secondary | ICD-10-CM | POA: Diagnosis not present

## 2019-06-27 DIAGNOSIS — L820 Inflamed seborrheic keratosis: Secondary | ICD-10-CM | POA: Diagnosis not present

## 2019-08-17 DIAGNOSIS — Z125 Encounter for screening for malignant neoplasm of prostate: Secondary | ICD-10-CM | POA: Diagnosis not present

## 2019-08-17 DIAGNOSIS — E538 Deficiency of other specified B group vitamins: Secondary | ICD-10-CM | POA: Diagnosis not present

## 2019-08-17 DIAGNOSIS — E7849 Other hyperlipidemia: Secondary | ICD-10-CM | POA: Diagnosis not present

## 2019-08-17 DIAGNOSIS — D72818 Other decreased white blood cell count: Secondary | ICD-10-CM | POA: Diagnosis not present

## 2019-08-17 DIAGNOSIS — R739 Hyperglycemia, unspecified: Secondary | ICD-10-CM | POA: Diagnosis not present

## 2019-08-24 DIAGNOSIS — I480 Paroxysmal atrial fibrillation: Secondary | ICD-10-CM | POA: Diagnosis not present

## 2019-08-24 DIAGNOSIS — Z0000 Encounter for general adult medical examination without abnormal findings: Secondary | ICD-10-CM | POA: Diagnosis not present

## 2019-08-24 DIAGNOSIS — E7849 Other hyperlipidemia: Secondary | ICD-10-CM | POA: Diagnosis not present

## 2019-08-24 DIAGNOSIS — D72818 Other decreased white blood cell count: Secondary | ICD-10-CM | POA: Diagnosis not present

## 2019-08-24 DIAGNOSIS — I700 Atherosclerosis of aorta: Secondary | ICD-10-CM | POA: Diagnosis not present

## 2019-08-24 DIAGNOSIS — R739 Hyperglycemia, unspecified: Secondary | ICD-10-CM | POA: Diagnosis not present

## 2019-08-24 DIAGNOSIS — R030 Elevated blood-pressure reading, without diagnosis of hypertension: Secondary | ICD-10-CM | POA: Diagnosis not present

## 2019-08-24 DIAGNOSIS — E538 Deficiency of other specified B group vitamins: Secondary | ICD-10-CM | POA: Diagnosis not present

## 2019-12-26 ENCOUNTER — Ambulatory Visit: Payer: PPO | Admitting: Dermatology

## 2019-12-26 ENCOUNTER — Encounter: Payer: Self-pay | Admitting: Dermatology

## 2019-12-26 ENCOUNTER — Other Ambulatory Visit: Payer: Self-pay

## 2019-12-26 DIAGNOSIS — L814 Other melanin hyperpigmentation: Secondary | ICD-10-CM | POA: Diagnosis not present

## 2019-12-26 DIAGNOSIS — D229 Melanocytic nevi, unspecified: Secondary | ICD-10-CM | POA: Diagnosis not present

## 2019-12-26 DIAGNOSIS — L0889 Other specified local infections of the skin and subcutaneous tissue: Secondary | ICD-10-CM

## 2019-12-26 DIAGNOSIS — Z85828 Personal history of other malignant neoplasm of skin: Secondary | ICD-10-CM

## 2019-12-26 DIAGNOSIS — L820 Inflamed seborrheic keratosis: Secondary | ICD-10-CM

## 2019-12-26 DIAGNOSIS — Z1283 Encounter for screening for malignant neoplasm of skin: Secondary | ICD-10-CM | POA: Diagnosis not present

## 2019-12-26 DIAGNOSIS — L821 Other seborrheic keratosis: Secondary | ICD-10-CM | POA: Diagnosis not present

## 2019-12-26 DIAGNOSIS — L578 Other skin changes due to chronic exposure to nonionizing radiation: Secondary | ICD-10-CM | POA: Diagnosis not present

## 2019-12-26 DIAGNOSIS — D1801 Hemangioma of skin and subcutaneous tissue: Secondary | ICD-10-CM | POA: Diagnosis not present

## 2019-12-26 DIAGNOSIS — L219 Seborrheic dermatitis, unspecified: Secondary | ICD-10-CM

## 2019-12-26 MED ORDER — KETOCONAZOLE 2 % EX CREA: 1.0000 "application " | TOPICAL_CREAM | Freq: Every day | CUTANEOUS | 4 refills | Status: AC

## 2019-12-26 NOTE — Patient Instructions (Signed)
Cryotherapy Aftercare  . Wash gently with soap and water everyday.   . Apply Vaseline and Band-Aid daily until healed.  

## 2019-12-26 NOTE — Progress Notes (Signed)
   Follow-Up Visit   Subjective  Anthony Saunders is a 69 y.o. male who presents for the following: UBSE (Upper body skin exam, hx of skin ca bil ears txted by another doctor in past, hx of AKs). The patient presents for Upper Body Skin Exam (UBSE) for skin cancer screening and mole check.  The following portions of the chart were reviewed this encounter and updated as appropriate:  Allergies  Meds  Problems  Med Hx  Surg Hx  Fam Hx      Review of Systems:  No other skin or systemic complaints except as noted in HPI or Assessment and Plan.  Objective  Well appearing patient in no apparent distress; mood and affect are within normal limits.  All skin waist up examined.  Objective  Right Upper Back x 1, L ear x 1 (2): Erythematous keratotic or waxy stuck-on papule or plaque.   Objective  Chest - Medial [facility]): Pink patches with greasy scale.    Assessment & Plan    Lentigines - Scattered tan macules - Discussed due to sun exposure - Benign, observe - Call for any changes  Seborrheic Keratoses - Stuck-on, waxy, tan-brown papules and plaques  - Discussed benign etiology and prognosis. - Observe - Call for any changes  Melanocytic Nevi - Tan-brown and/or pink-flesh-colored symmetric macules and papules - Benign appearing on exam today - Observation - Call clinic for new or changing moles - Recommend daily use of broad spectrum spf 30+ sunscreen to sun-exposed areas.   Hemangiomas - Red papules - Discussed benign nature - Observe - Call for any changes  Actinic Damage - diffuse scaly erythematous macules with underlying dyspigmentation - Recommend daily broad spectrum sunscreen SPF 30+ to sun-exposed areas, reapply every 2 hours as needed.  - Call for new or changing lesions.  Skin cancer screening performed today.  Hx Skin Cancer treated ~2013 by another physician  - Bil ears  Inflamed seborrheic keratosis (2) Right Upper Back x 1, L ear x  1  Destruction of lesion - Right Upper Back x 1, L ear x 1 Complexity: simple   Destruction method: cryotherapy   Informed consent: discussed and consent obtained   Timeout:  patient name, date of birth, surgical site, and procedure verified Lesion destroyed using liquid nitrogen: Yes   Region frozen until ice ball extended beyond lesion: Yes   Outcome: patient tolerated procedure well with no complications   Post-procedure details: wound care instructions given    Seborrheic dermatitis Chest - Medial (Center)  Cont Ketoconazole 2% shampoo or Start Ketoconazole 2% cream qd  ketoconazole (NIZORAL) 2 % cream - Chest - Medial (Center)  Pitted keratolysis bil feet  Cont Clindamycin gel qd prn flares  Skin cancer screening  Return in about 1 year (around 12/25/2020) for TBSE.   I, [name], RMA, am acting as scribe for Sarina Ser, MD .  Documentation: I have reviewed the above documentation for accuracy and completeness, and I agree with the above.  Sarina Ser, MD

## 2019-12-30 ENCOUNTER — Encounter: Payer: Self-pay | Admitting: Dermatology

## 2020-01-23 DIAGNOSIS — H40033 Anatomical narrow angle, bilateral: Secondary | ICD-10-CM | POA: Diagnosis not present

## 2020-02-05 DIAGNOSIS — H40033 Anatomical narrow angle, bilateral: Secondary | ICD-10-CM | POA: Diagnosis not present

## 2020-02-18 DIAGNOSIS — D3131 Benign neoplasm of right choroid: Secondary | ICD-10-CM | POA: Diagnosis not present

## 2020-03-28 DIAGNOSIS — E538 Deficiency of other specified B group vitamins: Secondary | ICD-10-CM | POA: Diagnosis not present

## 2020-03-28 DIAGNOSIS — R739 Hyperglycemia, unspecified: Secondary | ICD-10-CM | POA: Diagnosis not present

## 2020-03-28 DIAGNOSIS — E7849 Other hyperlipidemia: Secondary | ICD-10-CM | POA: Diagnosis not present

## 2020-04-03 DIAGNOSIS — N200 Calculus of kidney: Secondary | ICD-10-CM | POA: Diagnosis not present

## 2020-04-03 DIAGNOSIS — I480 Paroxysmal atrial fibrillation: Secondary | ICD-10-CM | POA: Diagnosis not present

## 2020-04-03 DIAGNOSIS — I700 Atherosclerosis of aorta: Secondary | ICD-10-CM | POA: Diagnosis not present

## 2020-04-03 DIAGNOSIS — E538 Deficiency of other specified B group vitamins: Secondary | ICD-10-CM | POA: Diagnosis not present

## 2020-04-03 DIAGNOSIS — D72818 Other decreased white blood cell count: Secondary | ICD-10-CM | POA: Diagnosis not present

## 2020-04-03 DIAGNOSIS — Z125 Encounter for screening for malignant neoplasm of prostate: Secondary | ICD-10-CM | POA: Diagnosis not present

## 2020-04-03 DIAGNOSIS — R739 Hyperglycemia, unspecified: Secondary | ICD-10-CM | POA: Diagnosis not present

## 2020-04-03 DIAGNOSIS — R030 Elevated blood-pressure reading, without diagnosis of hypertension: Secondary | ICD-10-CM | POA: Diagnosis not present

## 2020-04-03 DIAGNOSIS — E7849 Other hyperlipidemia: Secondary | ICD-10-CM | POA: Diagnosis not present

## 2020-07-12 IMAGING — CT CT RENAL STONE PROTOCOL
2 of 4 series · 14 of 46 positions shown, 16 images · non-contrast
Comparison: None.

CLINICAL DATA: Right-sided flank and back pain.  Vomiting.

EXAM:
CT ABDOMEN AND PELVIS WITHOUT CONTRAST
TECHNIQUE: Multidetector CT imaging of the abdomen and pelvis was performed
following the standard protocol without oral or IV contrast.

[Series 2: stone full standard · axial · 0.70mm/px · z∈[-982,-587]mm · 11 of 95 slices shown, 13 images]
[im 8/95  soft-tissue]
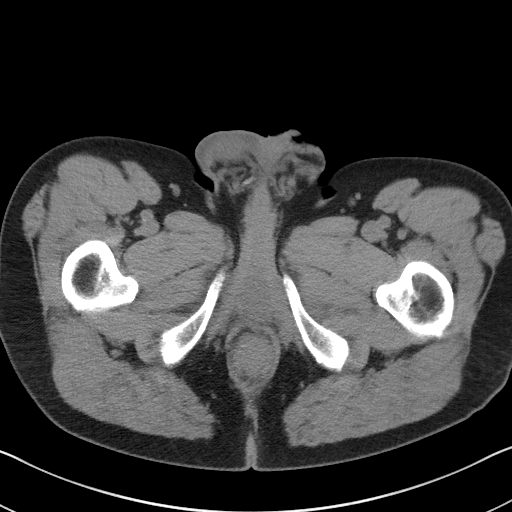
[im 8/95  bone]
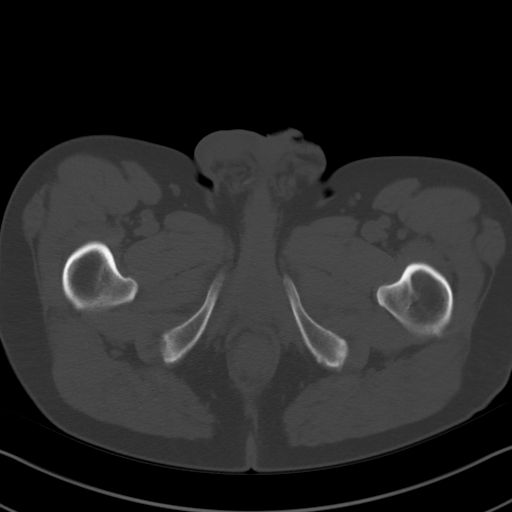
[im 16/95  soft-tissue]
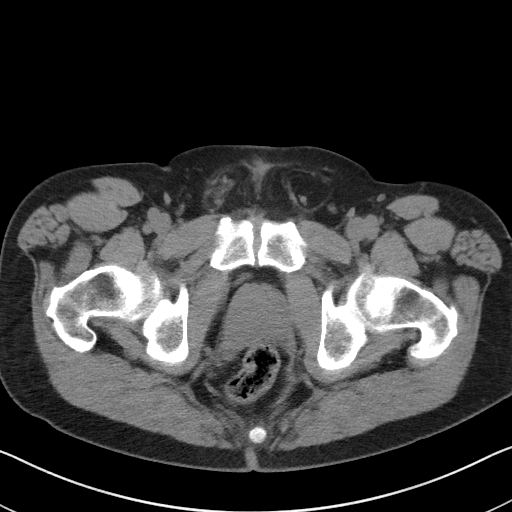
[im 23/95  soft-tissue]
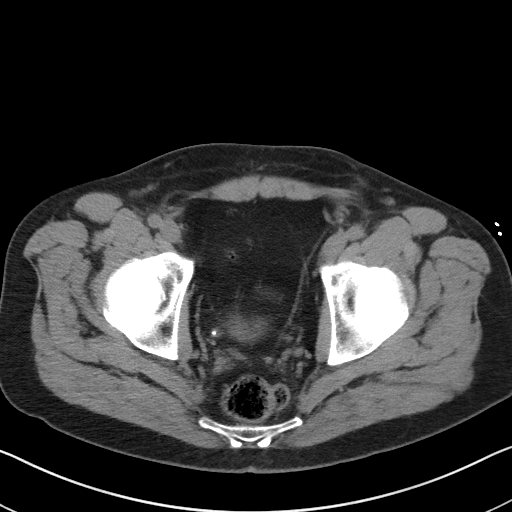
[im 31/95  soft-tissue]
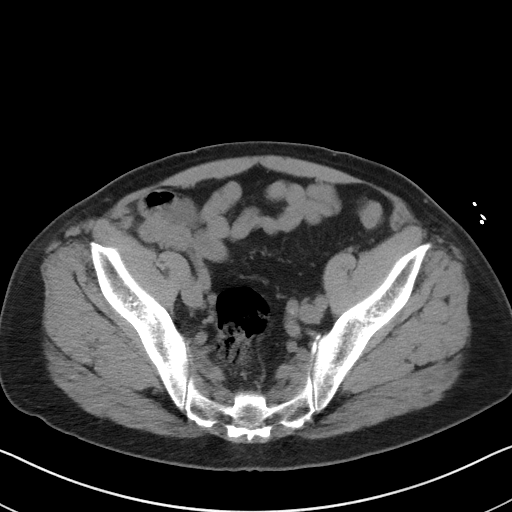
[im 38/95  soft-tissue]
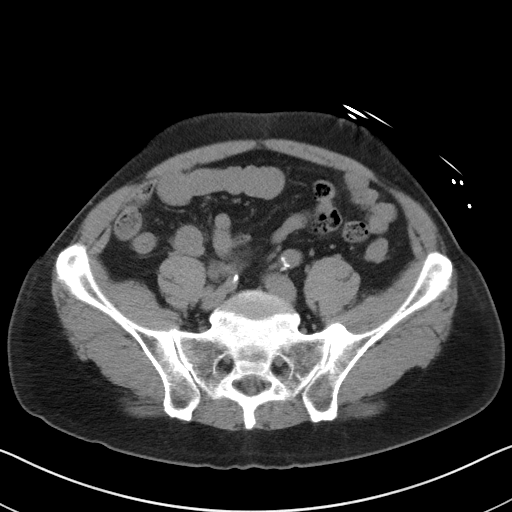
[im 49/95  soft-tissue]
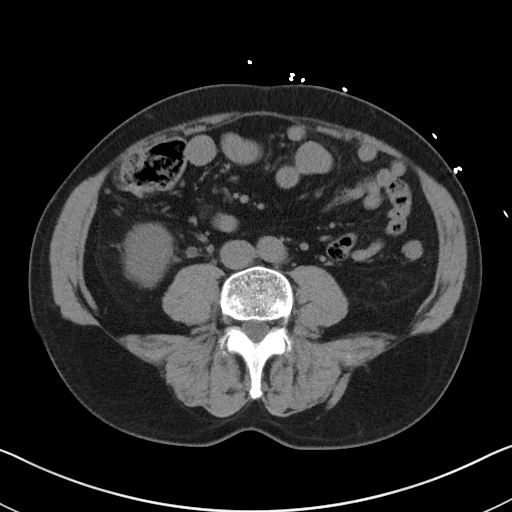
[im 57/95  soft-tissue]
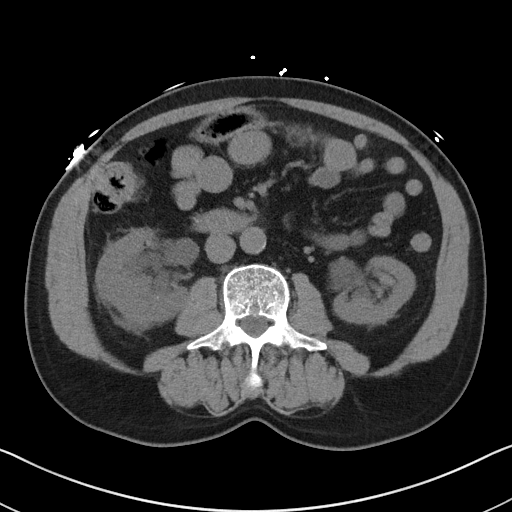
[im 64/95  soft-tissue]
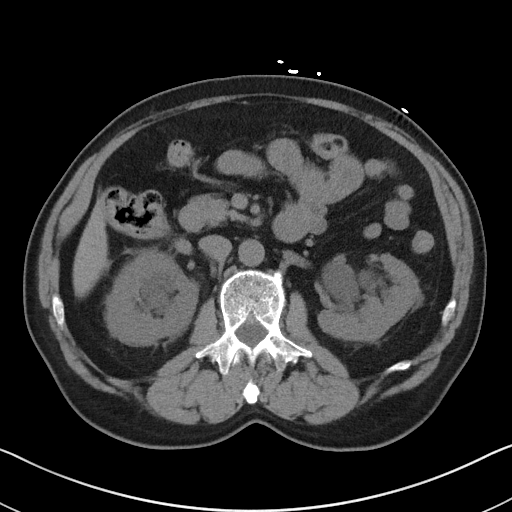
[im 72/95  soft-tissue]
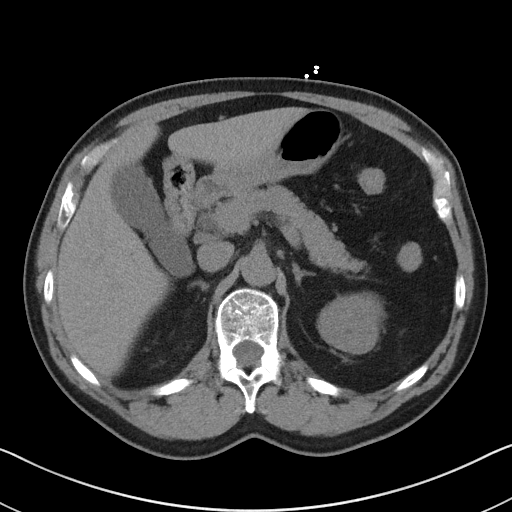
[im 72/95  bone]
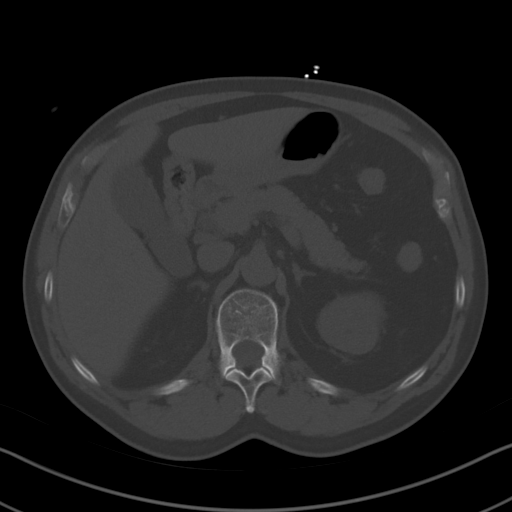
[im 79/95  soft-tissue]
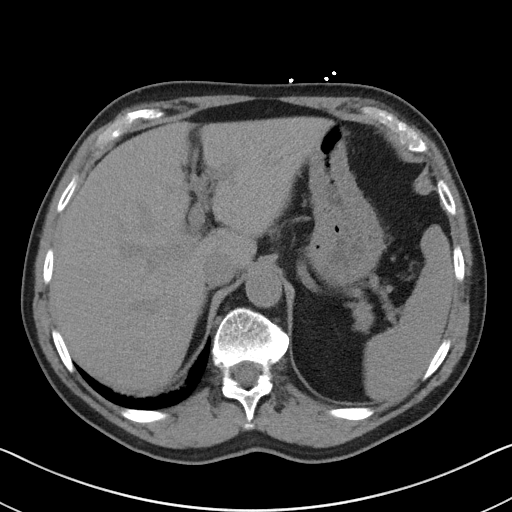
[im 87/95  soft-tissue]
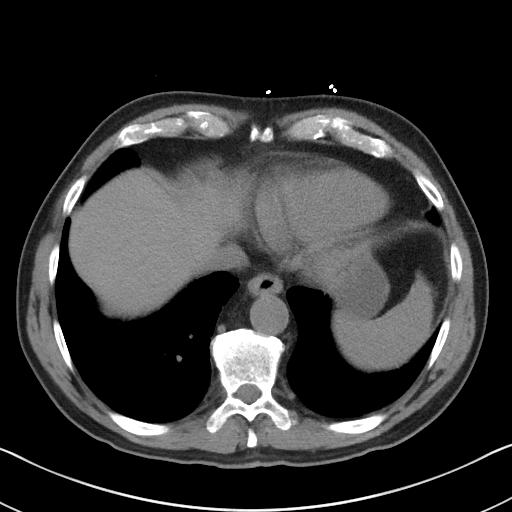

[Series 5: coronal · coronal · 0.72mm/px · 3 of 132 slices shown]
[im 44/132  soft-tissue]
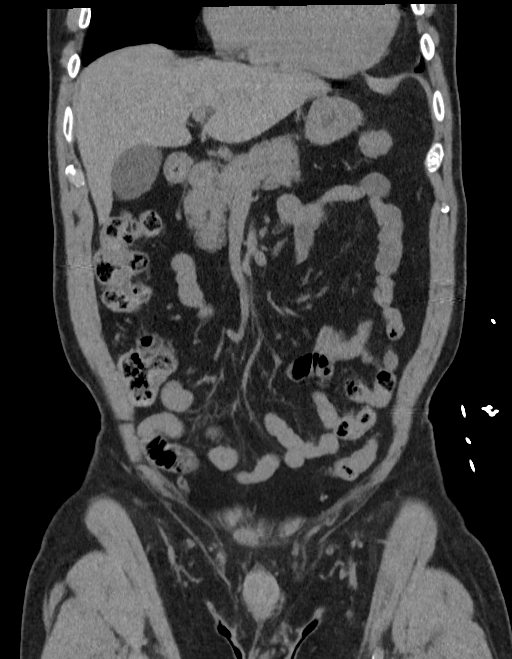
[im 59/132  soft-tissue]
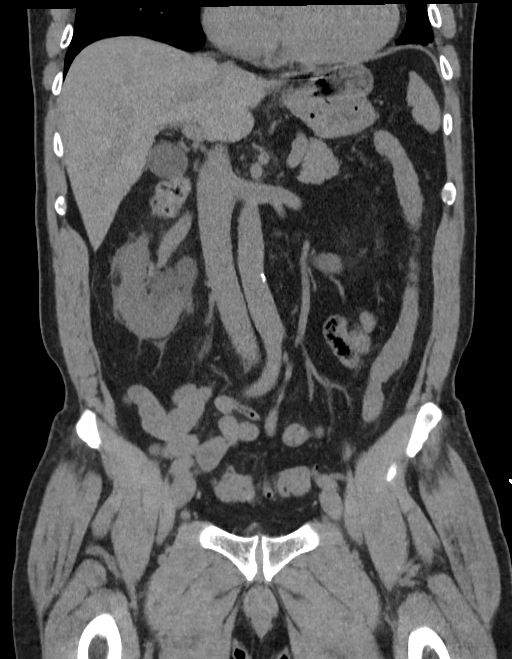
[im 73/132  soft-tissue]
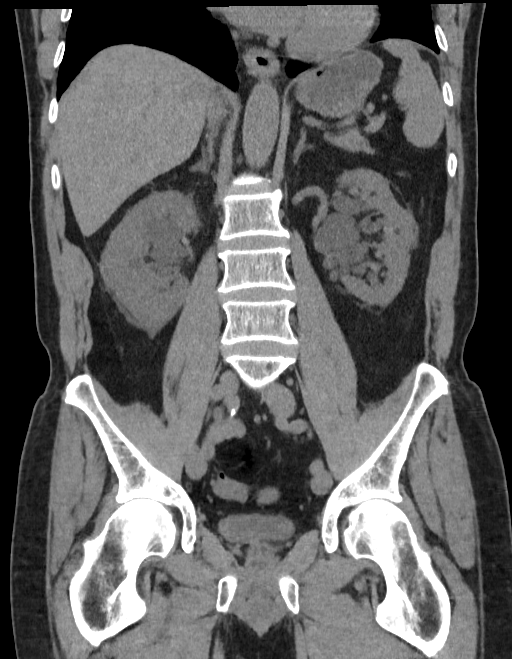

[14 of 46 positions shown; findings below may reference images not displayed]

FINDINGS: Lower chest: There is bibasilar atelectatic change. No lung base
edema or consolidation is evident. There is a small hiatal hernia.

Hepatobiliary: No focal liver lesions are appreciable on this
noncontrast enhanced study. Gallbladder wall is not appreciably
thickened. There is no biliary duct dilatation.

Pancreas: No pancreatic mass or inflammatory focus evident.

Spleen: No splenic lesions are appreciable.

Adrenals/Urinary Tract: Adrenals appear normal bilaterally. There is
perinephric stranding bilaterally, more severe on the right than on
the left. No evident renal mass on either side. There is apparent
moderate hydronephrosis bilaterally. There is a 1 mm nonobstructing
calculus in the lower pole the right kidney. No intrarenal calculus
evident on the left. There is an extrarenal pelvis on the left. No
appreciable ureterectasis is seen on the left. On the right, there
is dilatation of the ureter to the level of a calculus in the lower
right ureter at the mid acetabular level. This calculus measures 5 x
4 mm. No other ureteral calculi identified. Urinary bladder is
midline with wall thickness within normal limits for nearly empty
bladder state.

Stomach/Bowel: There are sigmoid diverticula without diverticulitis.
There is no appreciable bowel wall or mesenteric thickening. There
is no evident bowel obstruction. There is no free air or portal
venous air.

Vascular/Lymphatic: There are foci of aortic and left common iliac
artery atherosclerosis. No evident aneurysm. Major mesenteric
arterial vessels appear patent on this noncontrast enhanced study.
No adenopathy is evident in the abdomen or pelvis.

Reproductive: Prostate and seminal vesicles are normal in size and
contour. There are prostatic calculi. No evident pelvic mass.

Other: There is no periappendiceal region inflammation. There is no
abscess or ascites in the abdomen or pelvis. There is fat in each
inguinal ring. There is a small ventral hernia containing only fat.

Musculoskeletal: There are no blastic or lytic bone lesions. There
is no intramuscular lesion.
IMPRESSION: 1. 5 x 4 mm calculus distal right ureter at the mid acetabular level
causing moderate hydronephrosis and ureterectasis on the right.
There is moderate perinephric stranding on the right.

2. 1 mm nonobstructing calculus lower pole right kidney. Several
prostatic calculi evident.

3. Moderate apparent hydronephrosis on the left without
ureterectasis. Some of what appears to be hydronephrosis on the left
potentially could represent peripelvic cysts. The appearance on the
left is concerning for potential degree of chronic ureteropelvic
junction obstruction. Potentially nonemergent CT of the abdomen with
intravenous contrast could be helpful to assess for hydronephrosis
versus peripelvic cysts on the left.

4. Sigmoid diverticula without diverticulitis. No bowel obstruction.
No abscess in the abdomen or pelvis. No periappendiceal region
inflammatory change.

5. Small hiatal hernia. Small ventral hernia containing only fat.
There is fat in each inguinal ring.

6.  Mild aortoiliac atherosclerosis.

Aortic Atherosclerosis (GECFF-EIB.B).

## 2020-08-15 DIAGNOSIS — H903 Sensorineural hearing loss, bilateral: Secondary | ICD-10-CM | POA: Diagnosis not present

## 2020-08-19 DIAGNOSIS — H903 Sensorineural hearing loss, bilateral: Secondary | ICD-10-CM | POA: Diagnosis not present

## 2020-09-25 DIAGNOSIS — I480 Paroxysmal atrial fibrillation: Secondary | ICD-10-CM | POA: Diagnosis not present

## 2020-09-25 DIAGNOSIS — R739 Hyperglycemia, unspecified: Secondary | ICD-10-CM | POA: Diagnosis not present

## 2020-09-25 DIAGNOSIS — I700 Atherosclerosis of aorta: Secondary | ICD-10-CM | POA: Diagnosis not present

## 2020-09-25 DIAGNOSIS — Z125 Encounter for screening for malignant neoplasm of prostate: Secondary | ICD-10-CM | POA: Diagnosis not present

## 2020-09-25 DIAGNOSIS — E7849 Other hyperlipidemia: Secondary | ICD-10-CM | POA: Diagnosis not present

## 2020-10-02 DIAGNOSIS — E538 Deficiency of other specified B group vitamins: Secondary | ICD-10-CM | POA: Diagnosis not present

## 2020-10-02 DIAGNOSIS — D72818 Other decreased white blood cell count: Secondary | ICD-10-CM | POA: Diagnosis not present

## 2020-10-02 DIAGNOSIS — I480 Paroxysmal atrial fibrillation: Secondary | ICD-10-CM | POA: Diagnosis not present

## 2020-10-02 DIAGNOSIS — R030 Elevated blood-pressure reading, without diagnosis of hypertension: Secondary | ICD-10-CM | POA: Diagnosis not present

## 2020-10-02 DIAGNOSIS — E7849 Other hyperlipidemia: Secondary | ICD-10-CM | POA: Diagnosis not present

## 2020-10-02 DIAGNOSIS — Z0000 Encounter for general adult medical examination without abnormal findings: Secondary | ICD-10-CM | POA: Diagnosis not present

## 2020-10-02 DIAGNOSIS — I700 Atherosclerosis of aorta: Secondary | ICD-10-CM | POA: Diagnosis not present

## 2020-10-02 DIAGNOSIS — R739 Hyperglycemia, unspecified: Secondary | ICD-10-CM | POA: Diagnosis not present

## 2020-10-02 DIAGNOSIS — N200 Calculus of kidney: Secondary | ICD-10-CM | POA: Diagnosis not present

## 2020-11-13 DIAGNOSIS — H35372 Puckering of macula, left eye: Secondary | ICD-10-CM | POA: Diagnosis not present

## 2020-11-20 ENCOUNTER — Observation Stay
Admission: EM | Admit: 2020-11-20 | Discharge: 2020-11-21 | Disposition: A | Discharge status: Home / Self Care | Payer: PPO | Attending: Hospitalist | Admitting: Hospitalist

## 2020-11-20 ENCOUNTER — Observation Stay: Payer: PPO

## 2020-11-20 DIAGNOSIS — K219 Gastro-esophageal reflux disease without esophagitis: Secondary | ICD-10-CM | POA: Diagnosis not present

## 2020-11-20 DIAGNOSIS — N19 Unspecified kidney failure: Secondary | ICD-10-CM | POA: Diagnosis not present

## 2020-11-20 DIAGNOSIS — E860 Dehydration: Secondary | ICD-10-CM | POA: Diagnosis not present

## 2020-11-20 DIAGNOSIS — Z85828 Personal history of other malignant neoplasm of skin: Secondary | ICD-10-CM | POA: Insufficient documentation

## 2020-11-20 DIAGNOSIS — E785 Hyperlipidemia, unspecified: Secondary | ICD-10-CM | POA: Diagnosis not present

## 2020-11-20 DIAGNOSIS — I480 Paroxysmal atrial fibrillation: Secondary | ICD-10-CM

## 2020-11-20 DIAGNOSIS — Z7901 Long term (current) use of anticoagulants: Secondary | ICD-10-CM | POA: Diagnosis not present

## 2020-11-20 DIAGNOSIS — J9811 Atelectasis: Secondary | ICD-10-CM | POA: Diagnosis not present

## 2020-11-20 DIAGNOSIS — Z20822 Contact with and (suspected) exposure to covid-19: Secondary | ICD-10-CM | POA: Diagnosis not present

## 2020-11-20 DIAGNOSIS — R404 Transient alteration of awareness: Secondary | ICD-10-CM | POA: Diagnosis not present

## 2020-11-20 DIAGNOSIS — R55 Syncope and collapse: Principal | ICD-10-CM | POA: Insufficient documentation

## 2020-11-20 DIAGNOSIS — R531 Weakness: Secondary | ICD-10-CM | POA: Diagnosis not present

## 2020-11-20 DIAGNOSIS — I10 Essential (primary) hypertension: Secondary | ICD-10-CM | POA: Diagnosis not present

## 2020-11-20 HISTORY — DX: Hyperlipidemia, unspecified: E78.5

## 2020-11-20 HISTORY — DX: Paroxysmal atrial fibrillation: I48.0

## 2020-11-20 HISTORY — DX: Gastro-esophageal reflux disease without esophagitis: K21.9

## 2020-11-20 HISTORY — DX: Calculus of kidney: N20.0

## 2020-11-20 LAB — URINALYSIS, COMPLETE (UACMP) WITH MICROSCOPIC
Bacteria, UA: NONE SEEN
Bilirubin Urine: NEGATIVE
Glucose, UA: NEGATIVE mg/dL
Hgb urine dipstick: NEGATIVE
Ketones, ur: 20 mg/dL — AB
Leukocytes,Ua: NEGATIVE
Nitrite: NEGATIVE
Protein, ur: NEGATIVE mg/dL
Specific Gravity, Urine: 1.02 (ref 1.005–1.030)
pH: 6 (ref 5.0–8.0)

## 2020-11-20 LAB — BASIC METABOLIC PANEL
Anion gap: 8 (ref 5–15)
BUN: 25 mg/dL — ABNORMAL HIGH (ref 8–23)
CO2: 24 mmol/L (ref 22–32)
Calcium: 8.4 mg/dL — ABNORMAL LOW (ref 8.9–10.3)
Chloride: 108 mmol/L (ref 98–111)
Creatinine, Ser: 0.84 mg/dL (ref 0.61–1.24)
GFR, Estimated: >60 mL/min (ref >60)
Glucose, Bld: 114 mg/dL — ABNORMAL HIGH (ref 70–99)
Potassium: 3.9 mmol/L (ref 3.5–5.1)
Sodium: 140 mmol/L (ref 135–145)

## 2020-11-20 LAB — CBC
HCT: 41.2 % (ref 39.0–52.0)
Hemoglobin: 13.9 g/dL (ref 13.0–17.0)
MCH: 28.6 pg (ref 26.0–34.0)
MCHC: 33.7 g/dL (ref 30.0–36.0)
MCV: 84.8 fL (ref 80.0–100.0)
Platelets: 152 10*3/uL (ref 150–400)
RBC: 4.86 MIL/uL (ref 4.22–5.81)
RDW: 12.5 % (ref 11.5–15.5)
WBC: 4.7 10*3/uL (ref 4.0–10.5)
nRBC: 0 % (ref 0.0–0.2)

## 2020-11-20 LAB — MAGNESIUM: Magnesium: 2 mg/dL (ref 1.7–2.4)

## 2020-11-20 LAB — TROPONIN I (HIGH SENSITIVITY): Troponin I (High Sensitivity): 4 ng/L (ref <18)

## 2020-11-20 MED ORDER — ACETAMINOPHEN 325 MG PO TABS: 650.0000 mg | ORAL_TABLET | Freq: Four times a day (QID) | ORAL | Status: DC | PRN

## 2020-11-20 MED ORDER — ACETAMINOPHEN 650 MG RE SUPP: 650.0000 mg | Freq: Four times a day (QID) | RECTAL | Status: DC | PRN

## 2020-11-20 MED ORDER — LACTATED RINGERS IV BOLUS
1000.0000 mL | Freq: Once | INTRAVENOUS | Status: AC
  Administered 2020-11-20: 1000 mL via INTRAVENOUS

## 2020-11-20 MED ORDER — LACTATED RINGERS IV SOLN: INTRAVENOUS | Status: AC

## 2020-11-20 MED ORDER — ONDANSETRON HCL 4 MG/2ML IJ SOLN: 4.0000 mg | Freq: Four times a day (QID) | INTRAMUSCULAR | Status: DC | PRN

## 2020-11-20 MED ORDER — PANTOPRAZOLE SODIUM 40 MG PO TBEC
40.0000 mg | DELAYED_RELEASE_TABLET | Freq: Every day | ORAL | Status: DC
  Administered 2020-11-21: 40 mg via ORAL
  Filled 2020-11-20: qty 1

## 2020-11-20 NOTE — H&P (Signed)
History and Physical    PLEASE NOTE THAT DRAGON DICTATION SOFTWARE WAS USED IN THE CONSTRUCTION OF THIS NOTE.   Anthony Saunders IHK:[id number] DOB: 05/26/1951 DOA: 11/20/2020  PCP: Adin Hector, MD Patient coming from: home   I have personally briefly reviewed patient's old medical records in [place]  Chief Complaint: Loss of consciousness  HPI: Anthony Saunders is a 70 y.o. male with medical history significant for single episode of atrial fibrillation in 2016 and not chronically anticoagulated, hyperlipidemia, GERD who is admitted to [facility] on 11/20/2020 with syncope after presenting from home to [facility] ED for evaluation of 1 episode of loss of consciousness.   The following history is provided via my discussions with the patient as well as my discussions with the patient's wife, who is present at bedside, in addition to my discussions with the emergency department physician and via chart review.  The patient reports that he experienced a single episode of loss of consciousness after standing in a parking lot for greater than 10 minutes while talking with his son, who was present at his side.  After approximately 10 minutes without any positional changes or steps, the patient reports that he developed the sensation of lightheadedness/dizziness, which lasted for approximately 15 to 20 seconds, before he lost consciousness.  The patient's son reportedly was able to catch the patient as he lost consciousness, preventing him from falling to the asphalt.  At that time, family was able to contact EMS, who subsequently brought the patient to [facility] ED for further evaluation of syncope.  This episode was not associate with any generalized tonic-clonic activity, loss of bowel/bladder function, or tongue biting.  The patient denies any associated or ensuing chest pain, shortness of breath, diaphoresis, nausea/vomiting.  He also denies any associated acute focal weakness,  acute focal paresthesias, numbness, change in vision, slurred speech, facial droop, vertigo, dysphagia, or headache.  He confirms that he is on no antihypertensive or AV nodal blocking medications at home, but rather is on omeprazole as his sole home medication.  He denies any recent trauma, and denies any micturition in the 10 to 20 minutes leading up to his syncopal episode this evening.   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  He denies any recent nausea, vomiting, diarrhea, abdominal pain, melena, or hematochezia.  He also denies any recent subjective fever, chills, rigors, or generalized myalgias.  No recent dysuria or gross hematuria.  No recent traveling or known COVID-19 exposures.  Denies any recent development of peripheral edema, orthopnea, PND, calf tenderness, or lower extremity erythema.  The patient and his wife report that the patient has experienced 1 previous syncopal episode, and that this episode occurred several years ago in the setting of positional changes.  He reported sensation of diaphoresis associated with this prior syncopal episode.    Of note, the patient confirms a history of paroxysmal atrial fibrillation, conveying that over his life he has experienced a single known episode of atrial fibrillation, which occurred in 2016.  In the context of this isolated episode of atrial fibrillation, the patient reports that he engaged in discussions with his PCP regarding the risks versus benefits versus alternatives to chronic anticoagulation given his age and documented history of aortic atherosclerosis.  Patient conveys that as  a result of these discussions, that he has elected to not pursue formal chronic anticoagulation, but rather is on a baby aspirin on a every 48 hour basis.  He confirms that he is  on no AV nodal blocking medications as an outpatient.   Furthermore, per chart review, most recent echocardiogram occurred in November 2016, and showed a normal left ventricular cavity size, LVEF 60 to 65%, no evidence of focal wall motion abnormalities, and no evidence of significant valvular pathology.    ED Course:  Vital signs in the ED were notable for the following: Temperature max 98.4, heart rate 62 -72; blood pressure 136/73 -166/77; respiratory rate 14-22; oxygen saturation 96 to 99% on room air.  Labs were notable for the following: BMP was notable for the following: Sodium 140, bicarbonate 24, BUN 25, creatinine 0.84 relative to most recent prior value 0.9 on 09/25/2020, BUN to creatinine ratio 30, glucose 114.  High-sensitivity troponin I x1 noted to be 4.  CBC notable for white blood cell count of 4700 and hemoglobin 13.9.  Urinalysis showed no evidence of white blood cells, no bacteria, leukocyte Estrace negative, nitrate negative, specific gravity 1.020, and showed 20 ketones.  Screening nasopharyngeal COVID-19/influenza PCR was performed in the ED this evening, with results currently pending.  EKG performed in the ED, and while not released for viewing in epic at this time, was reviewed by EDP, who conveyed to me findings of normal sinus rhythm with normal intervals, and no evidence of acute ischemic changes.  While in the ED, the following were administered: Lactated Ringer's x1 L bolus.  Subsequently, the patient was admitted for overnight observation to the med-tele floor for further evaluation and management of presenting syncope.     Review of Systems: As per HPI otherwise 10 point review of systems negative.   Past Medical History:  Diagnosis Date  . Basal cell carcinoma    R and L ear txted by another doctor in past    No past surgical history on file.  Social History:  reports that he has never smoked. He has never used smokeless tobacco. He reports that he does  not drink alcohol and does not use drugs.   Allergies  Allergen Reactions  . Meloxicam Palpitations    Family History  Problem Relation Age of Onset  . CAD Father      Prior to Admission medications   Medication Sig Start Date End Date Taking? Authorizing Provider  cetirizine (ZYRTEC) 10 MG tablet Take 10 mg by mouth at bedtime.    [provider]  clindamycin (CLINDAGEL) 1 % gel Apply topically 2 (two) times daily.    [provider]  diltiazem (CARDIZEM CD) 180 MG 24 hr capsule Take 1 capsule (180 mg total) by mouth daily. 05/29/15   Shaune Pollackhen, Qing, MD  ketoconazole (NIZORAL) 2 % shampoo Apply 1 application topically 2 (two) times a week.    [provider]  omeprazole (PRILOSEC) 20 MG capsule Take 1 tablet by mouth daily. 12/11/19   [provider]  ondansetron (ZOFRAN ODT) 4 MG disintegrating tablet Take 1 tablet (4 mg total) by mouth every 8 (eight) hours as needed for nausea or vomiting. 07/07/18   Minna AntisPaduchowski, Kevin, MD  oxyCODONE-acetaminophen (PERCOCET) 5-325 MG tablet Take 1 tablet by mouth every 4 (four) hours as needed for severe pain. 07/07/18   Minna AntisPaduchowski, Kevin, MD  rivaroxaban (XARELTO) 20 MG TABS tablet Take 1 tablet (20 mg total) by mouth daily with supper. 05/29/15  Demetrios Loll, MD  tamsulosin (FLOMAX) 0.4 MG CAPS capsule Take 1 capsule (0.4 mg total) by mouth daily. 07/07/18   Harvest Dark, MD     Objective    Physical Exam: Vitals:   11/20/20 2057 11/20/20 2100 11/20/20 2130 11/20/20 2200  BP: (!) 166/77 (!) 166/77 (!) 149/71 136/73  Pulse: 64 72 68 62  Resp: 15 (!) 22 16 14   Temp: 98.4 F (36.9 C)     SpO2: 99% 97% 97% 96%  Weight:      Height:        General: appears to be stated age; alert, oriented Skin: warm, dry, no rash Head:  AT/[place] Mouth:  Oral mucosa membranes appear dry, normal dentition Neck: supple; trachea midline Heart:  RRR; did not appreciate any M/R/G Lungs: CTAB, did not appreciate any  wheezes, rales, or rhonchi Abdomen: + BS; soft, ND, NT Vascular: 2+ pedal pulses b/l; 2+ radial pulses b/l Extremities: no peripheral edema, no muscle wasting Neuro: strength and sensation intact in upper and lower extremities b/l     Labs on Admission: I have personally reviewed following labs and imaging studies  CBC: Recent Labs  Lab 11/20/20 2055  WBC 4.7  HGB 13.9  HCT 41.2  MCV 84.8  PLT 188   Basic Metabolic Panel: Recent Labs  Lab 11/20/20 2055  NA 140  K 3.9  CL 108  CO2 24  GLUCOSE 114*  BUN 25*  CREATININE 0.84  CALCIUM 8.4*   GFR: Estimated Creatinine Clearance: 80.4 mL/min (by C-G formula based on SCr of 0.84 mg/dL). Liver Function Tests: No results for input(s): AST, ALT, ALKPHOS, BILITOT, PROT, ALBUMIN in the last 168 hours. No results for input(s): LIPASE, AMYLASE in the last 168 hours. No results for input(s): AMMONIA in the last 168 hours. Coagulation Profile: No results for input(s): INR, PROTIME in the last 168 hours. Cardiac Enzymes: No results for input(s): CKTOTAL, CKMB, CKMBINDEX, TROPONINI in the last 168 hours. BNP (last 3 results) No results for input(s): PROBNP in the last 8760 hours. HbA1C: No results for input(s): HGBA1C in the last 72 hours. CBG: No results for input(s): GLUCAP in the last 168 hours. Lipid Profile: No results for input(s): CHOL, HDL, LDLCALC, TRIG, CHOLHDL, LDLDIRECT in the last 72 hours. Thyroid Function Tests: No results for input(s): TSH, T4TOTAL, FREET4, T3FREE, THYROIDAB in the last 72 hours. Anemia Panel: No results for input(s): VITAMINB12, FOLATE, FERRITIN, TIBC, IRON, RETICCTPCT in the last 72 hours. Urine analysis:    Component Value Date/Time   COLORURINE YELLOW (A) 11/20/2020 2100   APPEARANCEUR CLEAR (A) 11/20/2020 2100   APPEARANCEUR Hazy 02/07/2013 0142   LABSPEC 1.020 11/20/2020 2100   LABSPEC 1.021 02/07/2013 0142   PHURINE 6.0 11/20/2020 2100   GLUCOSEU NEGATIVE 11/20/2020 2100    GLUCOSEU Negative 02/07/2013 0142   HGBUR NEGATIVE 11/20/2020 2100   BILIRUBINUR NEGATIVE 11/20/2020 2100   BILIRUBINUR Negative 02/07/2013 0142   KETONESUR 20 (A) 11/20/2020 2100   PROTEINUR NEGATIVE 11/20/2020 2100   NITRITE NEGATIVE 11/20/2020 2100   LEUKOCYTESUR NEGATIVE 11/20/2020 2100   LEUKOCYTESUR Negative 02/07/2013 0142    Radiological Exams on Admission: No results found.    Assessment/Plan   Anthony Saunders is a 70 y.o. male with medical history significant for single episode of atrial fibrillation in 2016 and not chronically anticoagulated, hyperlipidemia, GERD who is admitted to [facility] on 11/20/2020 with syncope after presenting from home to [facility] ED for evaluation of 1 episode of loss  of consciousness.    Principal Problem:   Syncope Active Problems:   Dehydration   Acute prerenal azotemia   Paroxysmal A-fib (HCC)   HLD (hyperlipidemia)   GERD (gastroesophageal reflux disease)     #) Syncope: A single episode of syncope associated with prodrome.  additional IV fluids for intravascular repletion.  Trend serial troponin.  Monitor on telemetry.  Monitor on continuous pulse oximetry.  At this time, differential appears to favor vasovagal syncope in the setting of prolonged standing likely causing relative venous statis and decline in preload with further complication from dehydration as a consequence of performing prolonged chart renovations while engaging in very limited consumption of water.  Differential also includes arrhythmia, including heart block/sinus pauses not particularly given the patient's history of paroxysmal atrial fibrillation increasing his risk for sick sinus syndrome.  Ventricular arrhythmia felt to be less likely in the setting of prodromal features.  Of note, the patient confirms he is on no AV nodal blocking agents at home.  ACS is felt to be less likely, although we will continue to trend serial troponin to further evaluate  this.  Presentation not associate with any recent chest pain, EKG, per report of EDP, shows sinus rhythm with normal intervals, and no evidence of acute ischemic changes. Anticipate independent review of EKG upon release.  Not on any antihypertensive medications at home not associate with any overt acute focal neurologic deficits.  Considered the possibility of syncope due to stroke, including the possibility of embolic stroke given history of paroxysmal atrial fibrillation without chronic anticoagulation. However, clinically, acute ischemic stroke appear less likely at this time in the absence of any additional evidence of acute focal neurologic deficits.  Also, seizures felt to be less likely at this time.  Will check orthostatic vital signs, but with the caveat that the patient is already received IV fluids in the ED, potentially altering the results of this evaluation.    Plan: I have placed a nursing communication order requesting that orthostatic vital signs x 1 set be checked and documented, following which I have ordered LR @ 100 cc/hr x 10 hours to medical and physical exam evidence suggestive of dehydration.  Monitor on telemetry.  Monitor strict I's and O's.  Check CMP and CBC in the morning.  Add on serum magnesium level.  Monitor continuous pulse oximetry.  Trend serial troponin.  Check urinary drug screen and chest x-ray.  Independent review of presenting EKG, as above.       #) Dehydration: Presentation appears consistent with dehydration given physical exam revealing evidence of dry oral mucous membranes, while laboratory results also consistent with this suspicion, particularly given the presence of acute prerenal azotemia as well as elevated specific gravity on urinalysis.  Element of dehydration suspected to be on the basis of limited consumption of water over the last day in spite of prolonged active working hours while performing renovations at [employer].  Suspected dehydration may  have posed a contributing factor leading to presenting syncopal episode, as further detailed above.  Of note, 1 L LR bolus administered in the ED.   Plan: I placed an order for lactated Ringer's at 100 cc/h x 10 hours to be initiated following checking/documentation of orthostatic vital signs, as further detailed above.  Monitor strict I's and O's and daily weights.  Repeat BMP in the morning.        #) Paroxysmal atrial fibrillation: Documented history of such, with the patient reporting history of a single episode  of atrial fibrillation occurring in 2016, as further detailed above.  In the setting of a CHA2DS2-VASc score of 2 (1 for age and 1 for documented history of aortic atherosclerosis), there appears to be an indication for the patient to be on chronic anticoagulation for thromboembolic prophylaxis. However, in the context of this isolated episode of atrial fibrillation, the patient reports that he engaged in discussions with his PCP regarding the risks versus benefits versus alternatives to chronic anticoagulation given his age and documented history of aortic atherosclerosis.  Patient conveys that as a result of these discussions, that he has elected to not pursue formal chronic anticoagulation, but rather is on a baby aspirin on a every 48 hour basis.  He confirms that he is on no AV nodal blocking medications as an outpatient.  Appears to be in sinus rhythm at the time of this evening's presentation.  Most recent echocardiogram, which was performed in November 2016, with results that included normal left ventricular cavity size, LVEF 60 to 65%, and no significant valvular pathology.    Plan: monitor strict I's & O's and daily weights. Repeat BMP and CBC in the morning. Check serum magnesium level.  Monitor on telemetry in the setting of presenting syncopal episode.     #) GERD: Patient reports that he is omeprazole as an outpatient.  Plan: Continue home PPI.      #)  Hyperlipidemia: The patient reports that this is managed via lifestyle modifications, including diet, exercise, and weight loss.  He reports specifically that he is on no pharmacologic management of his hyperlipidemia as an outpatient.  Plan: Outpatient monitoring and management of hyperlipidemia per PCP.      DVT prophylaxis: SCDs Code Status: Full code Family Communication: The patient's case was discussed with his wife, who was present at bedside Disposition Plan: Per Rounding Team Consults called: none  Admission status: Observation; med telemetry     Of note, this patient was added by me to the following Admit List/Treatment Team: armcadmits.      PLEASE NOTE THAT DRAGON DICTATION SOFTWARE WAS USED IN THE CONSTRUCTION OF THIS NOTE.   [facility] Pager [phone] From [place]  Otherwise, please contact night-coverage  [email or web address] Password [facility]   11/20/2020, 10:59 PM

## 2020-11-20 NOTE — ED Notes (Signed)
Wife assisted pt to use urinal.

## 2020-11-20 NOTE — ED Triage Notes (Signed)
[place] [facility] for syncopal episode. Patient reports similar episode years ago, went to the hospital, unsure of diagnosis. patient was reportedly ashen at EMS arrival, now normal color.

## 2020-11-20 NOTE — ED Provider Notes (Signed)
[facility] Emergency Department Provider Note   ____________________________________________   Event Date/Time   First MD Initiated Contact with Patient 11/20/20 2057     (approximate)  I have reviewed the triage vital signs and the nursing notes.   HISTORY  Chief Complaint Loss of Consciousness    HPI Anthony Saunders is a 70 y.o. male with past medical history of atrial fibrillation who presents to the ED complaining of syncope.  Patient reports that he was standing talking to family when he suddenly started feeling lightheaded and dizzy.  He eventually lost consciousness and collapsed to the ground, was caught by his son and gently lowered to the ground.  He was reportedly unconscious for multiple minutes with gurgling respirations.  EMS was called and patient gradually woke up during transport.  He is now awake and alert, states he feels "foggy headed", but otherwise denies any complaints.  He did not have any chest pain or shortness of breath with this episode, states he has been feeling well recently with no fevers, cough, nausea, vomiting, or abdominal pain.  He passed out once multiple years ago, but states that was when he stood up too fast and today's episode was not associated with any positional changes.        Past Medical History:  Diagnosis Date  . Basal cell carcinoma    R and L ear txted by another doctor in past  . GERD (gastroesophageal reflux disease)   . HLD (hyperlipidemia)   . Nephrolithiasis   . Paroxysmal A-fib (HCC)    singal episode in 2016    Patient Active Problem List   Diagnosis Date Noted  . Syncope 11/20/2020  . Atrial fibrillation with RVR ([place]) 05/28/2015  . Atrial fibrillation with rapid ventricular response ([place]) 05/28/2015    History reviewed. No pertinent surgical history.  Prior to Admission medications   Medication Sig Start Date End Date Taking? Authorizing Provider  cetirizine (ZYRTEC) 10 MG tablet  Take 10 mg by mouth at bedtime.    [provider]  clindamycin (CLINDAGEL) 1 % gel Apply topically 2 (two) times daily.    [provider]  diltiazem (CARDIZEM CD) 180 MG 24 hr capsule Take 1 capsule (180 mg total) by mouth daily. 05/29/15   Demetrios Loll, MD  ketoconazole (NIZORAL) 2 % shampoo Apply 1 application topically 2 (two) times a week.    [provider]  omeprazole (PRILOSEC) 20 MG capsule Take 1 tablet by mouth daily. 12/11/19   [provider]  ondansetron (ZOFRAN ODT) 4 MG disintegrating tablet Take 1 tablet (4 mg total) by mouth every 8 (eight) hours as needed for nausea or vomiting. 07/07/18   Harvest Dark, MD  oxyCODONE-acetaminophen (PERCOCET) 5-325 MG tablet Take 1 tablet by mouth every 4 (four) hours as needed for severe pain. 07/07/18   Harvest Dark, MD  rivaroxaban (XARELTO) 20 MG TABS tablet Take 1 tablet (20 mg total) by mouth daily with supper. 05/29/15   Demetrios Loll, MD  tamsulosin (FLOMAX) 0.4 MG CAPS capsule Take 1 capsule (0.4 mg total) by mouth daily. 07/07/18   Harvest Dark, MD    Allergies Meloxicam  Family History  Problem Relation Age of Onset  . CAD Father     Social History Social History   Tobacco Use  . Smoking status: Never Smoker  . Smokeless tobacco: Never Used  Substance Use Topics  . Alcohol use: No  . Drug use: No    Review of  Systems  Constitutional: No fever/chills Eyes: No visual changes. ENT: No sore throat. Cardiovascular: Denies chest pain.  Positive for syncope. Respiratory: Denies shortness of breath. Gastrointestinal: No abdominal pain.  No nausea, no vomiting.  No diarrhea.  No constipation. Genitourinary: Negative for dysuria. Musculoskeletal: Negative for back pain. Skin: Negative for rash. Neurological: Negative for headaches, focal weakness or numbness.  ____________________________________________   PHYSICAL EXAM:  VITAL SIGNS: ED Triage Vitals  Enc Vitals  Group     BP 11/20/20 2057 (!) 166/77     Pulse Rate 11/20/20 2057 64     Resp 11/20/20 2057 15     Temp 11/20/20 2057 98.4 F (36.9 C)     Temp src --      SpO2 11/20/20 2057 99 %     Weight 11/20/20 2054 179 lb 7.3 oz (81.4 kg)     Height 11/20/20 2054 5\' 5"  (1.651 m)     Head Circumference --      Peak Flow --      Pain Score 11/20/20 2054 0     Pain Loc --      Pain Edu? --      Excl. in [place]? --     Constitutional: Alert and oriented. Eyes: Conjunctivae are normal. Head: Atraumatic. Nose: No congestion/rhinnorhea. Mouth/Throat: Mucous membranes are moist. Neck: Normal ROM Cardiovascular: Normal rate, regular rhythm. Grossly normal heart sounds.  2+ radial pulses bilaterally. Respiratory: Normal respiratory effort.  No retractions. Lungs CTAB. Gastrointestinal: Soft and nontender. No distention. Genitourinary: deferred Musculoskeletal: No lower extremity tenderness nor edema. Neurologic:  Normal speech and language. No gross focal neurologic deficits are appreciated. Skin:  Skin is warm, dry and intact. No rash noted. Psychiatric: Mood and affect are normal. Speech and behavior are normal.  ____________________________________________   LABS (all labs ordered are listed, but only abnormal results are displayed)  Labs Reviewed  BASIC METABOLIC PANEL - Abnormal; Notable for the following components:      Result Value   Glucose, Bld 114 (*)    BUN 25 (*)    Calcium 8.4 (*)    All other components within normal limits  URINALYSIS, COMPLETE (UACMP) WITH MICROSCOPIC - Abnormal; Notable for the following components:   Color, Urine YELLOW (*)    APPearance CLEAR (*)    Ketones, ur 20 (*)    All other components within normal limits  SARS CORONAVIRUS 2 (TAT 6-24 HRS)  CBC  HIV ANTIBODY (ROUTINE TESTING W REFLEX)  MAGNESIUM  TROPONIN I (HIGH SENSITIVITY)  TROPONIN I (HIGH SENSITIVITY)   ____________________________________________  EKG  ED ECG REPORT I, Blake Divine, the attending physician, personally viewed and interpreted this ECG.   Date: 11/20/2020  EKG Time: 20:56  Rate: 54  Rhythm: sinus bradycardia  Axis: Normal  Intervals:none  ST&T Change: None   PROCEDURES  Procedure(s) performed (including Critical Care):  Procedures   ____________________________________________   INITIAL IMPRESSION / ASSESSMENT AND PLAN / ED COURSE       70 year old male with past medical history of atrial fibrillation not currently on anticoagulation who presents to the ED following syncopal episode where he was lowered to the ground by family.  He was reportedly unconscious for a prolonged period of time, is now awake and alert but states he feels foggy headed.  EKG shows no evidence of arrhythmia or ischemia, patient in normal sinus rhythm on cardiac monitor.  We will check labs and hydrate with IV fluids, reassess.  Description of episode is concerning  for cardiac etiology of syncope.  Labs are unremarkable and patient remains in normal sinus rhythm on cardiac monitor.  Given concern for cardiac etiology of syncope, case discussed with hospitalist for admission.      ____________________________________________   FINAL CLINICAL IMPRESSION(S) / ED DIAGNOSES  Final diagnoses:  Syncope and collapse     ED Discharge Orders    None       Note:  This document was prepared using Dragon voice recognition software and may include unintentional dictation errors.   Blake Divine, MD 11/20/20 [phone]

## 2020-11-20 NOTE — Progress Notes (Signed)
Brief note regarding plan, with full H&P to follow:  70 year old male with history of single episode of atrial fibrillation in 2016, not chronically anticoagulated, hyperlipidemia, who is admitted at for further evaluation and management of single episode of syncope earlier this evening that occurred while the patient was standing for prolonged period of time in the parking lot talking with his family after limited consumption of food/water over the preceding hours, and associated with prodrome in the form of approximately 15 to 20 seconds of lightheadedness before loss of consciousness.  No positional changes immediately preceding this episode. not associated with any chest pain.  Initial troponin nonelevated at 4.  EKG, which has not yet been released, was conveyed by the EDP to demonstrate normal sinus rhythm with normal intervals and no evidence of acute ischemic changes.  Clinically, the patient appears mildly dehydrated with acute prerenal azotemia, dry oral mucosa membranes, and elevated specific gravity on urinalysis.  Will check orthostatic vital signs before providing additional IV fluids for intravascular repletion.  Trend serial troponin.  Monitor on telemetry.  Monitor on continuous pulse oximetry.  At this time, differential appears to favor vasovagal syncope in the setting of prolonged standing complicated by dehydration.  Differential also includes arrhythmia although ventricular arrhythmia less likely in the setting of prodromal features. Not on any AV nodal blockers at home.     Babs Bertin, DO Hospitalist

## 2020-11-20 NOTE — ED Notes (Signed)
RN called lab due to trop not running at this time. Lab adding on troponin to previously sent blood.

## 2020-11-21 ENCOUNTER — Other Ambulatory Visit: Payer: Self-pay

## 2020-11-21 DIAGNOSIS — R55 Syncope and collapse: Secondary | ICD-10-CM | POA: Diagnosis not present

## 2020-11-21 DIAGNOSIS — E860 Dehydration: Secondary | ICD-10-CM | POA: Diagnosis present

## 2020-11-21 DIAGNOSIS — I480 Paroxysmal atrial fibrillation: Secondary | ICD-10-CM | POA: Diagnosis present

## 2020-11-21 DIAGNOSIS — K219 Gastro-esophageal reflux disease without esophagitis: Secondary | ICD-10-CM | POA: Diagnosis present

## 2020-11-21 DIAGNOSIS — E785 Hyperlipidemia, unspecified: Secondary | ICD-10-CM | POA: Diagnosis present

## 2020-11-21 DIAGNOSIS — N19 Unspecified kidney failure: Secondary | ICD-10-CM | POA: Diagnosis present

## 2020-11-21 LAB — SARS CORONAVIRUS 2 (TAT 6-24 HRS): SARS Coronavirus 2: NEGATIVE

## 2020-11-21 LAB — BASIC METABOLIC PANEL
Anion gap: 5 (ref 5–15)
BUN: 17 mg/dL (ref 8–23)
CO2: 26 mmol/L (ref 22–32)
Calcium: 8.6 mg/dL — ABNORMAL LOW (ref 8.9–10.3)
Chloride: 111 mmol/L (ref 98–111)
Creatinine, Ser: 0.69 mg/dL (ref 0.61–1.24)
GFR, Estimated: >60 mL/min (ref >60)
Glucose, Bld: 124 mg/dL — ABNORMAL HIGH (ref 70–99)
Potassium: 3.8 mmol/L (ref 3.5–5.1)
Sodium: 142 mmol/L (ref 135–145)

## 2020-11-21 LAB — URINE DRUG SCREEN, QUALITATIVE (ARMC ONLY)
Amphetamines, Ur Screen: NOT DETECTED
Barbiturates, Ur Screen: NOT DETECTED
Benzodiazepine, Ur Scrn: NOT DETECTED
Cannabinoid 50 Ng, Ur ~~LOC~~: NOT DETECTED
Cocaine Metabolite,Ur ~~LOC~~: NOT DETECTED
MDMA (Ecstasy)Ur Screen: NOT DETECTED
Methadone Scn, Ur: NOT DETECTED
Opiate, Ur Screen: NOT DETECTED
Phencyclidine (PCP) Ur S: NOT DETECTED
Tricyclic, Ur Screen: NOT DETECTED

## 2020-11-21 LAB — CBC
HCT: 39.3 % (ref 39.0–52.0)
Hemoglobin: 13.2 g/dL (ref 13.0–17.0)
MCH: 28.6 pg (ref 26.0–34.0)
MCHC: 33.6 g/dL (ref 30.0–36.0)
MCV: 85.2 fL (ref 80.0–100.0)
Platelets: 148 10*3/uL — ABNORMAL LOW (ref 150–400)
RBC: 4.61 MIL/uL (ref 4.22–5.81)
RDW: 12.7 % (ref 11.5–15.5)
WBC: 4.4 10*3/uL (ref 4.0–10.5)
nRBC: 0 % (ref 0.0–0.2)

## 2020-11-21 LAB — MAGNESIUM: Magnesium: 2 mg/dL (ref 1.7–2.4)

## 2020-11-21 LAB — HIV ANTIBODY (ROUTINE TESTING W REFLEX): HIV Screen 4th Generation wRfx: NONREACTIVE

## 2020-11-21 LAB — TROPONIN I (HIGH SENSITIVITY): Troponin I (High Sensitivity): 5 ng/L (ref <18)

## 2020-11-21 NOTE — Discharge Summary (Signed)
Physician Discharge Summary   Anthony Saunders [facility]  male DOB: Dec 19, 1950  FYB:[id number]  PCP: Adin Hector, MD  Admit date: 11/20/2020 Discharge date: 11/21/2020  Admitted From: home Disposition:  home Wife updated at bedside prior to discharge.  CODE STATUS: Full code   Hospital Course:  For full details, please see H&P, progress notes, consult notes and ancillary notes.  Briefly,  Anthony Saunders is a 70 y.o. male with medical history significant for single episode of atrial fibrillation in 2016 and not chronically anticoagulated, hyperlipidemia, GERD who is admitted to [facility] on 11/20/2020 with syncope after presenting from home to [facility] ED for evaluation of 1 episode of loss of consciousness.   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  # Syncope Likely due to dehydration, orthostasis, and temporary reduced blood flow to the brain after prolonged standing.  BUN 25, Cr 0.84.  Pt felt better after IVF rehydration.  Tele monitor unremarkable.    Hx of paroxysmal Afib, not currently active Pt reported a single episode of atrial fibrillation occurring in 2016, and is not on rate control agent or anticoagulation at home currently.  GERD Continued PPI  HLD Continued statin   Discharge Diagnoses:  Principal Problem:   Syncope Active Problems:   Dehydration   Acute prerenal azotemia   Paroxysmal A-fib (HCC)   HLD (hyperlipidemia)   GERD (gastroesophageal reflux disease)     Discharge Instructions:  Allergies as of 11/21/2020      Reactions   Meloxicam Palpitations      Medication List    STOP taking these medications   clindamycin 1 % gel Commonly known as: CLINDAGEL   diltiazem 180 MG 24 hr capsule Commonly known as: Cardizem CD    ketoconazole 2 % shampoo Commonly known as: NIZORAL   ondansetron 4 MG disintegrating tablet Commonly known as: Zofran ODT   oxyCODONE-acetaminophen 5-325 MG tablet Commonly known as: Percocet   rivaroxaban 20 MG Tabs tablet Commonly known as: XARELTO   tamsulosin 0.4 MG Caps capsule Commonly known as: FLOMAX     TAKE these medications   aspirin EC 81 MG tablet Take 81 mg by mouth daily. Swallow whole.   B-12 1000 MCG Subl Take 1 tablet by mouth daily.   cetirizine 10 MG tablet Commonly known as: ZYRTEC Take 10 mg by mouth at bedtime.   omeprazole 20 MG capsule Commonly known as: PRILOSEC Take 1 tablet by mouth daily.        Follow-up Information    Tama High III, MD. Schedule an appointment as soon as possible for a visit in 1 week(s).   Specialty: Internal Medicine Contact information: [address] [facility] [place] [address] [phone]               Allergies  Allergen Reactions  . Meloxicam Palpitations     The results of significant diagnostics from this hospitalization (including imaging, microbiology, ancillary and laboratory) are listed below for reference.   Consultations:   Procedures/Studies: DG Chest Port 1 View  Result Date: 11/20/2020 CLINICAL DATA:  Recent syncopal episode EXAM: PORTABLE CHEST 1 VIEW COMPARISON:  05/28/2015 FINDINGS: Cardiac shadow is mildly prominent but accentuated by the portable technique. Mild right basilar atelectasis is seen.  No bony abnormality is noted. IMPRESSION: Mild right basilar atelectasis. Electronically Signed   By: Inez Catalina M.D.   On: 11/20/2020 23:37      Labs: BNP (last 3 results) No results for input(s): BNP in the last 8760 hours. Basic Metabolic Panel: Recent Labs  Lab 11/20/20 2055 11/21/20 0616  NA 140 142  K 3.9 3.8  CL 108 111  CO2 24 26  GLUCOSE 114* 124*  BUN 25* 17  CREATININE 0.84 0.69  CALCIUM 8.4* 8.6*  MG 2.0 2.0   Liver Function  Tests: No results for input(s): AST, ALT, ALKPHOS, BILITOT, PROT, ALBUMIN in the last 168 hours. No results for input(s): LIPASE, AMYLASE in the last 168 hours. No results for input(s): AMMONIA in the last 168 hours. CBC: Recent Labs  Lab 11/20/20 2055 11/21/20 0616  WBC 4.7 4.4  HGB 13.9 13.2  HCT 41.2 39.3  MCV 84.8 85.2  PLT 152 148*   Cardiac Enzymes: No results for input(s): CKTOTAL, CKMB, CKMBINDEX, TROPONINI in the last 168 hours. BNP: Invalid input(s): POCBNP CBG: No results for input(s): GLUCAP in the last 168 hours. D-Dimer No results for input(s): DDIMER in the last 72 hours. Hgb A1c No results for input(s): HGBA1C in the last 72 hours. Lipid Profile No results for input(s): CHOL, HDL, LDLCALC, TRIG, CHOLHDL, LDLDIRECT in the last 72 hours. Thyroid function studies No results for input(s): TSH, T4TOTAL, T3FREE, THYROIDAB in the last 72 hours.  Invalid input(s): FREET3 Anemia work up No results for input(s): VITAMINB12, FOLATE, FERRITIN, TIBC, IRON, RETICCTPCT in the last 72 hours. Urinalysis    Component Value Date/Time   COLORURINE YELLOW (A) 11/20/2020 2100   APPEARANCEUR CLEAR (A) 11/20/2020 2100   APPEARANCEUR Hazy 02/07/2013 0142   LABSPEC 1.020 11/20/2020 2100   LABSPEC 1.021 02/07/2013 0142   PHURINE 6.0 11/20/2020 2100   GLUCOSEU NEGATIVE 11/20/2020 2100   GLUCOSEU Negative 02/07/2013 0142   HGBUR NEGATIVE 11/20/2020 2100   BILIRUBINUR NEGATIVE 11/20/2020 2100   BILIRUBINUR Negative 02/07/2013 0142   KETONESUR 20 (A) 11/20/2020 2100   PROTEINUR NEGATIVE 11/20/2020 2100   NITRITE NEGATIVE 11/20/2020 2100   LEUKOCYTESUR NEGATIVE 11/20/2020 2100   LEUKOCYTESUR Negative 02/07/2013 0142   Sepsis Labs Invalid input(s): PROCALCITONIN,  WBC,  LACTICIDVEN Microbiology Recent Results (from the past 240 hour(s))  SARS CORONAVIRUS 2 (TAT 6-24 HRS) Nasopharyngeal Nasopharyngeal Swab     Status: None   Collection Time: 11/20/20 11:46 PM   Specimen:  Nasopharyngeal Swab  Result Value Ref Range Status   SARS Coronavirus 2 NEGATIVE NEGATIVE Final    Comment: (NOTE) SARS-CoV-2 target nucleic acids are NOT DETECTED.  The SARS-CoV-2 RNA is generally detectable in upper and lower respiratory specimens during the acute phase of infection. Negative results do not preclude SARS-CoV-2 infection, do not rule out co-infections with other pathogens, and should not be used as the sole basis for treatment or other patient management decisions. Negative results must be combined with clinical observations, patient history, and epidemiological information. The expected result is Negative.  Fact Sheet for Patients: SugarRoll.be  Fact Sheet for Healthcare Providers: https://www.woods-mathews.com/  This test is not yet approved or cleared by the [place] FDA and  has been authorized for detection and/or diagnosis of SARS-CoV-2 by FDA under an Emergency Use Authorization (EUA). This EUA will remain  in effect (meaning this test can be used) for the duration of the COVID-19 declaration under Se ction 564(b)(1) of the Act, 21 U.S.C. section 360bbb-3(b)(1), unless the authorization  is terminated or revoked sooner.  Performed at [facility], [place] [address], [place], [place] [address]      Total time spend on discharging this patient, including the last patient exam, discussing the hospital stay, instructions for ongoing care as it relates to all pertinent caregivers, as well as preparing the medical discharge records, prescriptions, and/or referrals as applicable, is 40 minutes.    Enzo Bi, MD  [facility] 11/21/2020, 11:32 AM

## 2020-11-21 NOTE — Progress Notes (Signed)
AVS reviewed with pt and wife including activity and medications and f/u appointments. All questions answered.

## 2020-12-02 ENCOUNTER — Other Ambulatory Visit: Payer: Self-pay | Admitting: Internal Medicine

## 2020-12-02 ENCOUNTER — Other Ambulatory Visit (HOSPITAL_COMMUNITY): Payer: Self-pay | Admitting: Internal Medicine

## 2020-12-02 DIAGNOSIS — I700 Atherosclerosis of aorta: Secondary | ICD-10-CM | POA: Diagnosis not present

## 2020-12-02 DIAGNOSIS — R55 Syncope and collapse: Secondary | ICD-10-CM

## 2020-12-02 DIAGNOSIS — R739 Hyperglycemia, unspecified: Secondary | ICD-10-CM | POA: Diagnosis not present

## 2020-12-02 DIAGNOSIS — N200 Calculus of kidney: Secondary | ICD-10-CM | POA: Diagnosis not present

## 2020-12-02 DIAGNOSIS — I480 Paroxysmal atrial fibrillation: Secondary | ICD-10-CM | POA: Diagnosis not present

## 2020-12-02 DIAGNOSIS — E7849 Other hyperlipidemia: Secondary | ICD-10-CM | POA: Diagnosis not present

## 2020-12-02 DIAGNOSIS — R030 Elevated blood-pressure reading, without diagnosis of hypertension: Secondary | ICD-10-CM | POA: Diagnosis not present

## 2020-12-02 DIAGNOSIS — E538 Deficiency of other specified B group vitamins: Secondary | ICD-10-CM | POA: Diagnosis not present

## 2020-12-15 ENCOUNTER — Other Ambulatory Visit: Payer: Self-pay

## 2020-12-15 ENCOUNTER — Ambulatory Visit
Admission: RE | Admit: 2020-12-15 | Discharge: 2020-12-15 | Disposition: A | Discharge status: Home / Self Care | Payer: PPO | Source: Ambulatory Visit | Attending: Internal Medicine | Admitting: Internal Medicine

## 2020-12-15 DIAGNOSIS — R55 Syncope and collapse: Secondary | ICD-10-CM | POA: Diagnosis not present

## 2020-12-15 DIAGNOSIS — R519 Headache, unspecified: Secondary | ICD-10-CM | POA: Diagnosis not present

## 2020-12-22 DIAGNOSIS — R55 Syncope and collapse: Secondary | ICD-10-CM | POA: Diagnosis not present

## 2020-12-31 ENCOUNTER — Ambulatory Visit: Payer: PPO | Admitting: Dermatology

## 2020-12-31 ENCOUNTER — Other Ambulatory Visit: Payer: Self-pay

## 2020-12-31 DIAGNOSIS — L0889 Other specified local infections of the skin and subcutaneous tissue: Secondary | ICD-10-CM

## 2020-12-31 DIAGNOSIS — L814 Other melanin hyperpigmentation: Secondary | ICD-10-CM

## 2020-12-31 DIAGNOSIS — Z85828 Personal history of other malignant neoplasm of skin: Secondary | ICD-10-CM | POA: Diagnosis not present

## 2020-12-31 DIAGNOSIS — L821 Other seborrheic keratosis: Secondary | ICD-10-CM

## 2020-12-31 DIAGNOSIS — D1800 Hemangioma unspecified site: Secondary | ICD-10-CM | POA: Diagnosis not present

## 2020-12-31 DIAGNOSIS — L578 Other skin changes due to chronic exposure to nonionizing radiation: Secondary | ICD-10-CM

## 2020-12-31 DIAGNOSIS — L570 Actinic keratosis: Secondary | ICD-10-CM

## 2020-12-31 DIAGNOSIS — Z1283 Encounter for screening for malignant neoplasm of skin: Secondary | ICD-10-CM | POA: Diagnosis not present

## 2020-12-31 DIAGNOSIS — L820 Inflamed seborrheic keratosis: Secondary | ICD-10-CM

## 2020-12-31 DIAGNOSIS — L219 Seborrheic dermatitis, unspecified: Secondary | ICD-10-CM

## 2020-12-31 DIAGNOSIS — D229 Melanocytic nevi, unspecified: Secondary | ICD-10-CM | POA: Diagnosis not present

## 2020-12-31 NOTE — Progress Notes (Signed)
Follow-Up Visit   Subjective  Anthony Saunders is a 70 y.o. male who presents for the following: Total body skin exam (Hx of skin ca bil ears txte yrs ago), Seborrheic Dermatitis (Chest, Ketoconazole 2% shampoo prn, Ketoconazole 2% cr prn), Pitted keratolysis (Bil feet, Clindamycin gel prn), scaly spots (Bil ears/), and check spot (R temple, irritated). The patient presents for Total-Body Skin Exam (TBSE) for skin cancer screening and mole check.  The following portions of the chart were reviewed this encounter and updated as appropriate:   Tobacco  Allergies  Meds  Problems  Med Hx  Surg Hx  Fam Hx      Review of Systems:  No other skin or systemic complaints except as noted in HPI or Assessment and Plan.  Objective  Well appearing patient in no apparent distress; mood and affect are within normal limits.  A full examination was performed including scalp, head, eyes, ears, nose, lips, neck, chest, axillae, abdomen, back, buttocks, bilateral upper extremities, bilateral lower extremities, hands, feet, fingers, toes, fingernails, and toenails. All findings within normal limits unless otherwise noted below.  bil feet Clear today  Chest - Medial (Center) Pink patches with greasy scale.   face, ears x 8 (8) Pink scaly macules   R temple x 1, Total = 1 Erythematous keratotic or waxy stuck-on papule or plaque.    Assessment & Plan  Pitted keratolysis bil feet Chronic, persistent Cont Clindamycin gel qd prn flares  Seborrheic dermatitis Chest - Medial (Center)  Seborrheic Dermatitis  -  is a chronic persistent rash characterized by pinkness and scaling most commonly of the mid face but also can occur on the scalp (dandruff), ears; mid chest and mid back. It tends to be exacerbated by stress and cooler weather.  People who have neurologic disease may experience new onset or exacerbation of existing seborrheic dermatitis.  The condition is not curable but treatable and can  be controlled.  Cont Ketoconazole 2% shampoo prn flares Cont Ketoconazole 2% cr qd prn flares  AK (actinic keratosis) (8) face, ears x 8  Destruction of lesion - face, ears x 8 Complexity: simple   Destruction method: cryotherapy   Informed consent: discussed and consent obtained   Timeout:  patient name, date of birth, surgical site, and procedure verified Lesion destroyed using liquid nitrogen: Yes   Region frozen until ice ball extended beyond lesion: Yes   Outcome: patient tolerated procedure well with no complications   Post-procedure details: wound care instructions given    Inflamed seborrheic keratosis R temple x 1, Total = 1  Destruction of lesion - R temple x 1, Total = 1 Complexity: simple   Destruction method: cryotherapy   Informed consent: discussed and consent obtained   Timeout:  patient name, date of birth, surgical site, and procedure verified Lesion destroyed using liquid nitrogen: Yes   Region frozen until ice ball extended beyond lesion: Yes   Outcome: patient tolerated procedure well with no complications   Post-procedure details: wound care instructions given    Lentigines - Scattered tan macules - Due to sun exposure - Benign-appering, observe - Recommend daily broad spectrum sunscreen SPF 30+ to sun-exposed areas, reapply every 2 hours as needed. - Call for any changes  Seborrheic Keratoses - Stuck-on, waxy, tan-brown papules and/or plaques  - Benign-appearing - Discussed benign etiology and prognosis. - Observe - Call for any changes  Melanocytic Nevi - Tan-brown and/or pink-flesh-colored symmetric macules and papules - Benign appearing on exam today -  Observation - Call clinic for new or changing moles - Recommend daily use of broad spectrum spf 30+ sunscreen to sun-exposed areas.   Hemangiomas - Red papules - Discussed benign nature - Observe - Call for any changes  Actinic Damage - Chronic condition, secondary to cumulative  UV/sun exposure - diffuse scaly erythematous macules with underlying dyspigmentation - Recommend daily broad spectrum sunscreen SPF 30+ to sun-exposed areas, reapply every 2 hours as needed.  - Staying in the shade or wearing long sleeves, sun glasses (UVA+UVB protection) and wide brim hats (4-inch brim around the entire circumference of the hat) are also recommended for sun protection.  - Call for new or changing lesions.  Skin cancer screening performed today.  History of Skin Cancer  Bil ears, txted in past Clear. Observe for recurrence.  Call clinic for new or changing lesions.   Recommend regular skin exams, daily broad-spectrum spf 30+ sunscreen use, and photoprotection.      Return in about 1 year (around 12/31/2021) for TBSE hx of Skin CA ears, hx of AKs, Seb derm, Pitted Keratolysis.  I, [name], RMA, am acting as scribe for Sarina Ser, MD . Documentation: I have reviewed the above documentation for accuracy and completeness, and I agree with the above.  Sarina Ser, MD

## 2020-12-31 NOTE — Patient Instructions (Signed)

## 2021-01-06 ENCOUNTER — Encounter: Payer: Self-pay | Admitting: Dermatology

## 2021-04-02 DIAGNOSIS — E7849 Other hyperlipidemia: Secondary | ICD-10-CM | POA: Diagnosis not present

## 2021-04-02 DIAGNOSIS — E538 Deficiency of other specified B group vitamins: Secondary | ICD-10-CM | POA: Diagnosis not present

## 2021-04-02 DIAGNOSIS — R739 Hyperglycemia, unspecified: Secondary | ICD-10-CM | POA: Diagnosis not present

## 2021-04-09 DIAGNOSIS — I480 Paroxysmal atrial fibrillation: Secondary | ICD-10-CM | POA: Diagnosis not present

## 2021-04-09 DIAGNOSIS — I10 Essential (primary) hypertension: Secondary | ICD-10-CM | POA: Diagnosis not present

## 2021-04-09 DIAGNOSIS — I700 Atherosclerosis of aorta: Secondary | ICD-10-CM | POA: Diagnosis not present

## 2021-04-09 DIAGNOSIS — R739 Hyperglycemia, unspecified: Secondary | ICD-10-CM | POA: Diagnosis not present

## 2021-04-09 DIAGNOSIS — E7849 Other hyperlipidemia: Secondary | ICD-10-CM | POA: Diagnosis not present

## 2021-04-09 DIAGNOSIS — D72818 Other decreased white blood cell count: Secondary | ICD-10-CM | POA: Diagnosis not present

## 2021-04-09 DIAGNOSIS — E538 Deficiency of other specified B group vitamins: Secondary | ICD-10-CM | POA: Diagnosis not present

## 2021-05-04 DIAGNOSIS — I10 Essential (primary) hypertension: Secondary | ICD-10-CM | POA: Diagnosis not present

## 2021-05-11 DIAGNOSIS — E7849 Other hyperlipidemia: Secondary | ICD-10-CM | POA: Diagnosis not present

## 2021-05-11 DIAGNOSIS — I10 Essential (primary) hypertension: Secondary | ICD-10-CM | POA: Diagnosis not present

## 2021-05-11 DIAGNOSIS — Z125 Encounter for screening for malignant neoplasm of prostate: Secondary | ICD-10-CM | POA: Diagnosis not present

## 2021-05-11 DIAGNOSIS — R739 Hyperglycemia, unspecified: Secondary | ICD-10-CM | POA: Diagnosis not present

## 2021-05-11 DIAGNOSIS — D72818 Other decreased white blood cell count: Secondary | ICD-10-CM | POA: Diagnosis not present

## 2021-05-11 DIAGNOSIS — I700 Atherosclerosis of aorta: Secondary | ICD-10-CM | POA: Diagnosis not present

## 2021-05-11 DIAGNOSIS — I480 Paroxysmal atrial fibrillation: Secondary | ICD-10-CM | POA: Diagnosis not present

## 2021-05-11 DIAGNOSIS — E538 Deficiency of other specified B group vitamins: Secondary | ICD-10-CM | POA: Diagnosis not present

## 2021-11-02 DIAGNOSIS — I700 Atherosclerosis of aorta: Secondary | ICD-10-CM | POA: Diagnosis not present

## 2021-11-02 DIAGNOSIS — R739 Hyperglycemia, unspecified: Secondary | ICD-10-CM | POA: Diagnosis not present

## 2021-11-02 DIAGNOSIS — E7849 Other hyperlipidemia: Secondary | ICD-10-CM | POA: Diagnosis not present

## 2021-11-02 DIAGNOSIS — I480 Paroxysmal atrial fibrillation: Secondary | ICD-10-CM | POA: Diagnosis not present

## 2021-11-02 DIAGNOSIS — I10 Essential (primary) hypertension: Secondary | ICD-10-CM | POA: Diagnosis not present

## 2021-11-02 DIAGNOSIS — Z125 Encounter for screening for malignant neoplasm of prostate: Secondary | ICD-10-CM | POA: Diagnosis not present

## 2021-11-02 DIAGNOSIS — E538 Deficiency of other specified B group vitamins: Secondary | ICD-10-CM | POA: Diagnosis not present

## 2021-11-10 DIAGNOSIS — E538 Deficiency of other specified B group vitamins: Secondary | ICD-10-CM | POA: Diagnosis not present

## 2021-11-10 DIAGNOSIS — E7849 Other hyperlipidemia: Secondary | ICD-10-CM | POA: Diagnosis not present

## 2021-11-10 DIAGNOSIS — D72818 Other decreased white blood cell count: Secondary | ICD-10-CM | POA: Diagnosis not present

## 2021-11-10 DIAGNOSIS — I700 Atherosclerosis of aorta: Secondary | ICD-10-CM | POA: Diagnosis not present

## 2021-11-10 DIAGNOSIS — Z1211 Encounter for screening for malignant neoplasm of colon: Secondary | ICD-10-CM | POA: Diagnosis not present

## 2021-11-10 DIAGNOSIS — Z0000 Encounter for general adult medical examination without abnormal findings: Secondary | ICD-10-CM | POA: Diagnosis not present

## 2021-11-10 DIAGNOSIS — I10 Essential (primary) hypertension: Secondary | ICD-10-CM | POA: Diagnosis not present

## 2021-11-10 DIAGNOSIS — I480 Paroxysmal atrial fibrillation: Secondary | ICD-10-CM | POA: Diagnosis not present

## 2021-11-10 DIAGNOSIS — R739 Hyperglycemia, unspecified: Secondary | ICD-10-CM | POA: Diagnosis not present

## 2021-11-24 DIAGNOSIS — L03032 Cellulitis of left toe: Secondary | ICD-10-CM | POA: Diagnosis not present

## 2021-12-03 ENCOUNTER — Ambulatory Visit: Payer: PPO | Admitting: Podiatry

## 2022-01-07 ENCOUNTER — Encounter: Payer: PPO | Admitting: Dermatology

## 2022-01-19 ENCOUNTER — Ambulatory Visit: Payer: PPO | Admitting: Dermatology

## 2022-01-19 DIAGNOSIS — D229 Melanocytic nevi, unspecified: Secondary | ICD-10-CM

## 2022-01-19 DIAGNOSIS — L820 Inflamed seborrheic keratosis: Secondary | ICD-10-CM

## 2022-01-19 DIAGNOSIS — D1800 Hemangioma unspecified site: Secondary | ICD-10-CM

## 2022-01-19 DIAGNOSIS — Z85828 Personal history of other malignant neoplasm of skin: Secondary | ICD-10-CM | POA: Diagnosis not present

## 2022-01-19 DIAGNOSIS — Z1283 Encounter for screening for malignant neoplasm of skin: Secondary | ICD-10-CM

## 2022-01-19 DIAGNOSIS — L821 Other seborrheic keratosis: Secondary | ICD-10-CM | POA: Diagnosis not present

## 2022-01-19 DIAGNOSIS — L814 Other melanin hyperpigmentation: Secondary | ICD-10-CM

## 2022-01-19 DIAGNOSIS — L578 Other skin changes due to chronic exposure to nonionizing radiation: Secondary | ICD-10-CM | POA: Diagnosis not present

## 2022-01-19 NOTE — Progress Notes (Signed)
Follow-Up Visit   Subjective  Anthony Saunders is a 71 y.o. male who presents for the following: Annual Exam (History of BCC - The patient presents for Total-Body Skin Exam (TBSE) for skin cancer screening and mole check.  The patient has spots, moles and lesions to be evaluated, some may be new or changing and the patient has concerns that these could be cancer./).  The following portions of the chart were reviewed this encounter and updated as appropriate:   Tobacco  Allergies  Meds  Problems  Med Hx  Surg Hx  Fam Hx     Review of Systems:  No other skin or systemic complaints except as noted in HPI or Assessment and Plan.  Objective  Well appearing patient in no apparent distress; mood and affect are within normal limits.  A full examination was performed including scalp, head, eyes, ears, nose, lips, neck, chest, axillae, abdomen, back, buttocks, bilateral upper extremities, bilateral lower extremities, hands, feet, fingers, toes, fingernails, and toenails. All findings within normal limits unless otherwise noted below.  Left face x 5, back x 14 (19) Erythematous stuck-on, waxy papule or plaque   Assessment & Plan   History of Basal Cell Carcinoma of the Skin - No evidence of recurrence today - Recommend regular full body skin exams - Recommend daily broad spectrum sunscreen SPF 30+ to sun-exposed areas, reapply every 2 hours as needed.  - Call if any new or changing lesions are noted between office visits  Lentigines - Scattered tan macules - Due to sun exposure - Benign-appearing, observe - Recommend daily broad spectrum sunscreen SPF 30+ to sun-exposed areas, reapply every 2 hours as needed. - Call for any changes  Seborrheic Keratoses - Stuck-on, waxy, tan-brown papules and/or plaques  - Benign-appearing - Discussed benign etiology and prognosis. - Observe - Call for any changes  Melanocytic Nevi - Tan-brown and/or pink-flesh-colored symmetric macules and  papules - Benign appearing on exam today - Observation - Call clinic for new or changing moles - Recommend daily use of broad spectrum spf 30+ sunscreen to sun-exposed areas.   Hemangiomas - Red papules - Discussed benign nature - Observe - Call for any changes  Actinic Damage - Chronic condition, secondary to cumulative UV/sun exposure - diffuse scaly erythematous macules with underlying dyspigmentation - Recommend daily broad spectrum sunscreen SPF 30+ to sun-exposed areas, reapply every 2 hours as needed.  - Staying in the shade or wearing long sleeves, sun glasses (UVA+UVB protection) and wide brim hats (4-inch brim around the entire circumference of the hat) are also recommended for sun protection.  - Call for new or changing lesions.  Skin cancer screening performed today.  Inflamed seborrheic keratosis (19) Left face x 5, back x 14  Destruction of lesion - Left face x 5, back x 14 Complexity: simple   Destruction method: cryotherapy   Informed consent: discussed and consent obtained   Timeout:  patient name, date of birth, surgical site, and procedure verified Lesion destroyed using liquid nitrogen: Yes   Region frozen until ice ball extended beyond lesion: Yes   Outcome: patient tolerated procedure well with no complications   Post-procedure details: wound care instructions given    Skin cancer screening   Return in about 1 year (around 01/20/2023) for TBSE.  I, Ashok Cordia, CMA, am acting as scribe for Sarina Ser, MD . Documentation: I have reviewed the above documentation for accuracy and completeness, and I agree with the above.  Sarina Ser, MD

## 2022-01-19 NOTE — Patient Instructions (Signed)
Cryotherapy Aftercare  Wash gently with soap and water everyday.   Apply Vaseline and Band-Aid daily until healed.     Due to recent changes in healthcare laws, you may see results of your pathology and/or laboratory studies on MyChart before the doctors have had a chance to review them. We understand that in some cases there may be results that are confusing or concerning to you. Please understand that not all results are received at the same time and often the doctors may need to interpret multiple results in order to provide you with the best plan of care or course of treatment. Therefore, we ask that you please give us 2 business days to thoroughly review all your results before contacting the office for clarification. Should we see a critical lab result, you will be contacted sooner.   If You Need Anything After Your Visit  If you have any questions or concerns for your doctor, please call our main line at [phone] and press option 4 to reach your doctor's medical assistant. If no one answers, please leave a voicemail as directed and we will return your call as soon as possible. Messages left after 4 pm will be answered the following business day.   You may also send us a message via MyChart. We typically respond to MyChart messages within 1-2 business days.  For prescription refills, please ask your pharmacy to contact our office. Our fax number is [phone].  If you have an urgent issue when the clinic is closed that cannot wait until the next business day, you can page your doctor at the number below.    Please note that while we do our best to be available for urgent issues outside of office hours, we are not available 24/7.   If you have an urgent issue and are unable to reach us, you may choose to seek medical care at your doctor's office, retail clinic, urgent care center, or emergency room.  If you have a medical emergency, please immediately call 911 or go to the  emergency department.  Pager Numbers  - Dr. Kowalski: [phone]  - Dr. Moye: [phone]  - Dr. Stewart: [phone]  In the event of inclement weather, please call our main line at [phone] for an update on the status of any delays or closures.  Dermatology Medication Tips: Please keep the boxes that topical medications come in in order to help keep track of the instructions about where and how to use these. Pharmacies typically print the medication instructions only on the boxes and not directly on the medication tubes.   If your medication is too expensive, please contact our office at [phone] option 4 or send us a message through MyChart.   We are unable to tell what your co-pay for medications will be in advance as this is different depending on your insurance coverage. However, we may be able to find a substitute medication at lower cost or fill out paperwork to get insurance to cover a needed medication.   If a prior authorization is required to get your medication covered by your insurance company, please allow us 1-2 business days to complete this process.  Drug prices often vary depending on where the prescription is filled and some pharmacies may offer cheaper prices.  The website www.goodrx.com contains coupons for medications through different pharmacies. The prices here do not account for what the cost may be with help from insurance (it may be cheaper with your insurance), but the website can   give you the price if you did not use any insurance.  - You can print the associated coupon and take it with your prescription to the pharmacy.  - You may also stop by our office during regular business hours and pick up a GoodRx coupon card.  - If you need your prescription sent electronically to a different pharmacy, notify our office through [facility] MyChart or by phone at [phone] option 4.     Si Usted Necesita Algo Despus de Su Visita  Tambin puede  enviarnos un mensaje a travs de MyChart. Por lo general respondemos a los mensajes de MyChart en el transcurso de 1 a 2 das hbiles.  Para renovar recetas, por favor pida a su farmacia que se ponga en contacto con nuestra oficina. Nuestro nmero de fax es el [phone].  Si tiene un asunto urgente cuando la clnica est cerrada y que no puede esperar hasta el siguiente da hbil, puede llamar/localizar a su doctor(a) al nmero que aparece a continuacin.   Por favor, tenga en cuenta que aunque hacemos todo lo posible para estar disponibles para asuntos urgentes fuera del horario de oficina, no estamos disponibles las 24 horas del da, los 7 das de la semana.   Si tiene un problema urgente y no puede comunicarse con nosotros, puede optar por buscar atencin mdica  en el consultorio de su doctor(a), en una clnica privada, en un centro de atencin urgente o en una sala de emergencias.  Si tiene una emergencia mdica, por favor llame inmediatamente al 911 o vaya a la sala de emergencias.  Nmeros de bper  - Dr. Kowalski: [phone]  - Dra. Moye: [phone]  - Dra. Stewart: [phone]  En caso de inclemencias del tiempo, por favor llame a nuestra lnea principal al [phone] para una actualizacin sobre el estado de cualquier retraso o cierre.  Consejos para la medicacin en dermatologa: Por favor, guarde las cajas en las que vienen los medicamentos de uso tpico para ayudarle a seguir las instrucciones sobre dnde y cmo usarlos. Las farmacias generalmente imprimen las instrucciones del medicamento slo en las cajas y no directamente en los tubos del medicamento.   Si su medicamento es muy caro, por favor, pngase en contacto con nuestra oficina llamando al [phone] y presione la opcin 4 o envenos un mensaje a travs de MyChart.   No podemos decirle cul ser su copago por los medicamentos por adelantado ya que esto es diferente dependiendo de la cobertura de su seguro.  Sin embargo, es posible que podamos encontrar un medicamento sustituto a menor costo o llenar un formulario para que el seguro cubra el medicamento que se considera necesario.   Si se requiere una autorizacin previa para que su compaa de seguros cubra su medicamento, por favor permtanos de 1 a 2 das hbiles para completar este proceso.  Los precios de los medicamentos varan con frecuencia dependiendo del lugar de dnde se surte la receta y alguna farmacias pueden ofrecer precios ms baratos.  El sitio web www.goodrx.com tiene cupones para medicamentos de diferentes farmacias. Los precios aqu no tienen en cuenta lo que podra costar con la ayuda del seguro (puede ser ms barato con su seguro), pero el sitio web puede darle el precio si no utiliz ningn seguro.  - Puede imprimir el cupn correspondiente y llevarlo con su receta a la farmacia.  - Tambin puede pasar por nuestra oficina durante el horario de atencin regular y recoger una tarjeta de cupones de GoodRx.  -   Si necesita que su receta se enve electrnicamente a una farmacia diferente, informe a nuestra oficina a travs de MyChart de  o por telfono llamando al [phone] y presione la opcin 4.  

## 2022-01-20 DIAGNOSIS — H0100A Unspecified blepharitis right eye, upper and lower eyelids: Secondary | ICD-10-CM | POA: Diagnosis not present

## 2022-01-20 DIAGNOSIS — D3131 Benign neoplasm of right choroid: Secondary | ICD-10-CM | POA: Diagnosis not present

## 2022-01-20 DIAGNOSIS — E119 Type 2 diabetes mellitus without complications: Secondary | ICD-10-CM | POA: Diagnosis not present

## 2022-01-20 DIAGNOSIS — H40033 Anatomical narrow angle, bilateral: Secondary | ICD-10-CM | POA: Diagnosis not present

## 2022-01-20 DIAGNOSIS — H0100B Unspecified blepharitis left eye, upper and lower eyelids: Secondary | ICD-10-CM | POA: Diagnosis not present

## 2022-01-20 DIAGNOSIS — H35372 Puckering of macula, left eye: Secondary | ICD-10-CM | POA: Diagnosis not present

## 2022-01-27 ENCOUNTER — Encounter: Payer: Self-pay | Admitting: Dermatology

## 2022-02-04 DIAGNOSIS — Z1211 Encounter for screening for malignant neoplasm of colon: Secondary | ICD-10-CM | POA: Diagnosis not present

## 2022-02-04 DIAGNOSIS — Z87898 Personal history of other specified conditions: Secondary | ICD-10-CM | POA: Diagnosis not present

## 2022-05-07 DIAGNOSIS — E538 Deficiency of other specified B group vitamins: Secondary | ICD-10-CM | POA: Diagnosis not present

## 2022-05-07 DIAGNOSIS — E7849 Other hyperlipidemia: Secondary | ICD-10-CM | POA: Diagnosis not present

## 2022-05-07 DIAGNOSIS — R739 Hyperglycemia, unspecified: Secondary | ICD-10-CM | POA: Diagnosis not present

## 2022-05-07 DIAGNOSIS — I10 Essential (primary) hypertension: Secondary | ICD-10-CM | POA: Diagnosis not present

## 2022-05-10 ENCOUNTER — Ambulatory Visit: Payer: PPO | Admitting: Anesthesiology

## 2022-05-10 ENCOUNTER — Encounter: Payer: Self-pay | Admitting: *Deleted

## 2022-05-10 ENCOUNTER — Other Ambulatory Visit: Payer: Self-pay

## 2022-05-10 ENCOUNTER — Encounter
Admission: RE | Disposition: A | Discharge status: Home / Self Care | Payer: Self-pay | Source: Home / Self Care | Attending: Gastroenterology

## 2022-05-10 ENCOUNTER — Ambulatory Visit
Admission: RE | Admit: 2022-05-10 | Discharge: 2022-05-10 | Disposition: A | Discharge status: Home / Self Care | Payer: PPO | Attending: Gastroenterology | Admitting: Gastroenterology

## 2022-05-10 DIAGNOSIS — K5730 Diverticulosis of large intestine without perforation or abscess without bleeding: Secondary | ICD-10-CM | POA: Insufficient documentation

## 2022-05-10 DIAGNOSIS — K640 First degree hemorrhoids: Secondary | ICD-10-CM | POA: Insufficient documentation

## 2022-05-10 DIAGNOSIS — K219 Gastro-esophageal reflux disease without esophagitis: Secondary | ICD-10-CM | POA: Diagnosis not present

## 2022-05-10 DIAGNOSIS — Z1211 Encounter for screening for malignant neoplasm of colon: Secondary | ICD-10-CM | POA: Diagnosis not present

## 2022-05-10 DIAGNOSIS — I10 Essential (primary) hypertension: Secondary | ICD-10-CM | POA: Diagnosis not present

## 2022-05-10 DIAGNOSIS — K5790 Diverticulosis of intestine, part unspecified, without perforation or abscess without bleeding: Secondary | ICD-10-CM | POA: Diagnosis not present

## 2022-05-10 HISTORY — PX: COLONOSCOPY WITH PROPOFOL: SHX5780

## 2022-05-10 SURGERY — COLONOSCOPY WITH PROPOFOL
Anesthesia: General

## 2022-05-10 MED ORDER — LIDOCAINE HCL (CARDIAC) PF 100 MG/5ML IV SOSY
PREFILLED_SYRINGE | INTRAVENOUS | Status: DC | PRN
  Administered 2022-05-10: 50 mg via INTRAVENOUS

## 2022-05-10 MED ORDER — PROPOFOL 500 MG/50ML IV EMUL
INTRAVENOUS | Status: DC | PRN
  Administered 2022-05-10: 150 ug/kg/min via INTRAVENOUS

## 2022-05-10 MED ORDER — PROPOFOL 10 MG/ML IV BOLUS
INTRAVENOUS | Status: DC | PRN
  Administered 2022-05-10: 80 mg via INTRAVENOUS

## 2022-05-10 MED ORDER — PROPOFOL 1000 MG/100ML IV EMUL
INTRAVENOUS | Status: AC
  Filled 2022-05-10: qty 100

## 2022-05-10 MED ORDER — SODIUM CHLORIDE 0.9 % IV SOLN: INTRAVENOUS | Status: DC

## 2022-05-10 NOTE — H&P (Signed)
Outpatient short stay form Pre-procedure 05/10/2022  Anthony Rubenstein, MD  Primary Physician: Adin Hector, MD  Reason for visit:  Screening  History of present illness:    71 y/o gentleman with history of hypertension here for screening colonoscopy. No blood thinners. No abdominal surgeries. No family history of GI malignancies.    Current Facility-Administered Medications:    0.9 %  sodium chloride infusion, , Intravenous, Continuous, Adlee Paar, Hilton Cork, MD, Last Rate: 20 mL/hr at 05/10/22 0955, Restarted at 05/10/22 1019  Medications Prior to Admission  Medication Sig Dispense Refill Last Dose   chlorthalidone (HYGROTON) 25 MG tablet Take 25 mg by mouth daily.   05/10/2022 at 0630   Cyanocobalamin (B-12) 1000 MCG SUBL Take 1 tablet by mouth daily.   05/09/2022   aspirin EC 81 MG tablet Take 81 mg by mouth daily. Swallow whole.   05/08/2022   cetirizine (ZYRTEC) 10 MG tablet Take 10 mg by mouth at bedtime.   05/08/2022   omeprazole (PRILOSEC) 20 MG capsule Take 1 tablet by mouth daily.   05/08/2022     Allergies  Allergen Reactions   Meloxicam Palpitations     Past Medical History:  Diagnosis Date   Basal cell carcinoma    R and L ear txted by another doctor in past   GERD (gastroesophageal reflux disease)    HLD (hyperlipidemia)    Nephrolithiasis    Paroxysmal A-fib ([place])    singal episode in 2016    Review of systems:  Otherwise negative.    Physical Exam  Gen: Alert, oriented. Appears stated age.  HEENT: PERRLA. Lungs: No respiratory distress CV: RRR Abd: soft, benign, no masses Ext: No edema    Planned procedures: Proceed with colonoscopy. The patient understands the nature of the planned procedure, indications, risks, alternatives and potential complications including but not limited to bleeding, infection, perforation, damage to internal organs and possible oversedation/side effects from anesthesia. The patient agrees and gives consent to  proceed.  Please refer to procedure notes for findings, recommendations and patient disposition/instructions.     Anthony Rubenstein, MD [facility] Gastroenterology

## 2022-05-10 NOTE — Interval H&P Note (Signed)
History and Physical Interval Note:  05/10/2022 10:24 AM  Anthony Saunders - [facility]  has presented today for surgery, with the diagnosis of Colon Cancer Screening.  The various methods of treatment have been discussed with the patient and family. After consideration of risks, benefits and other options for treatment, the patient has consented to  Procedure(s): COLONOSCOPY WITH PROPOFOL (N/A) as a surgical intervention.  The patient's history has been reviewed, patient examined, no change in status, stable for surgery.  I have reviewed the patient's chart and labs.  Questions were answered to the patient's satisfaction.     Lesly Rubenstein  Ok to proceed with colonoscopy

## 2022-05-10 NOTE — Anesthesia Preprocedure Evaluation (Addendum)
Anesthesia Evaluation  Patient identified by MRN, date of birth, ID band Patient awake    Reviewed: Allergy & Precautions, NPO status , Patient's Chart, lab work & pertinent test results  Airway Mallampati: I  TM Distance: >3 FB Neck ROM: full    Dental no notable dental hx.    Pulmonary neg pulmonary ROS,    Pulmonary exam normal        Cardiovascular Exercise Tolerance: Good hypertension, Pt. on medications Normal cardiovascular exam  pAF single episode 2016   Neuro/Psych negative neurological ROS  negative psych ROS   GI/Hepatic Neg liver ROS, GERD  Controlled and Medicated,  Endo/Other  negative endocrine ROS  Renal/GU negative Renal ROS  negative genitourinary   Musculoskeletal   Abdominal Normal abdominal exam  (+)   Peds  Hematology negative hematology ROS (+)   Anesthesia Other Findings Past Medical History: No date: Basal cell carcinoma     Comment:  R and L ear txted by another doctor in past No date: GERD (gastroesophageal reflux disease) No date: HLD (hyperlipidemia) No date: Nephrolithiasis No date: Paroxysmal A-fib ([place])     Comment:  singal episode in 2016  BMI    Body Mass Index: 27.44 kg/m      Reproductive/Obstetrics negative OB ROS                            Anesthesia Physical Anesthesia Plan  ASA: 2  Anesthesia Plan: General   Post-op Pain Management: Minimal or no pain anticipated   Induction: Intravenous  PONV Risk Score and Plan: Propofol infusion and TIVA  Airway Management Planned: Natural Airway and Nasal Cannula  Additional Equipment:   Intra-op Plan:   Post-operative Plan:   Informed Consent: I have reviewed the patients History and Physical, chart, labs and discussed the procedure including the risks, benefits and alternatives for the proposed anesthesia with the patient or authorized representative who has indicated his/her  understanding and acceptance.     Dental Advisory Given  Plan Discussed with: Anesthesiologist, CRNA and Surgeon  Anesthesia Plan Comments:        Anesthesia Quick Evaluation

## 2022-05-10 NOTE — Transfer of Care (Signed)
Immediate Anesthesia Transfer of Care Note  Patient: Anthony Saunders [employer]  Procedure(s) Performed: COLONOSCOPY WITH PROPOFOL  Patient Location: PACU  Anesthesia Type:General  Level of Consciousness: sedated  Airway & Oxygen Therapy: Patient Spontanous Breathing  Post-op Assessment: Report given to RN and Post -op Vital signs reviewed and stable  Post vital signs: Reviewed and stable  Last Vitals:  Vitals Value Taken Time  BP 114/67 05/10/22 1051  Temp    Pulse 66 05/10/22 1051  Resp 14 05/10/22 1051  SpO2 98 % 05/10/22 1051  Vitals shown include unvalidated device data.  Last Pain:  Vitals:   05/10/22 1049  TempSrc:   PainSc: 0-No pain         Complications: No notable events documented.

## 2022-05-10 NOTE — Anesthesia Postprocedure Evaluation (Signed)
Anesthesia Post Note  Patient: Anthony Saunders [facility]  Procedure(s) Performed: COLONOSCOPY WITH PROPOFOL  Patient location during evaluation: PACU Anesthesia Type: General Level of consciousness: awake and alert, oriented and patient cooperative Pain management: pain level controlled Vital Signs Assessment: post-procedure vital signs reviewed and stable Respiratory status: spontaneous breathing, nonlabored ventilation and respiratory function stable Cardiovascular status: blood pressure returned to baseline and stable Postop Assessment: adequate PO intake Anesthetic complications: no   No notable events documented.   Last Vitals:  Vitals:   05/10/22 1050 05/10/22 1051  BP:  114/67  Pulse: 65   Resp: 11   Temp:    SpO2: 98%     Last Pain:  Vitals:   05/10/22 1056  TempSrc:   PainSc: 0-No pain                 Darrin Nipper

## 2022-05-10 NOTE — Op Note (Signed)
[facility] Gastroenterology Patient Name: Anthony Saunders Procedure Date: 05/10/2022 10:18 AM MRN: [id number] Account #: [id number] Date of Birth: [birth year] Admit Type: Outpatient Age: 71 Room: [facility] ENDO ROOM 3 Gender: Male Note Status: Finalized Instrument Name: Jasper Riling [id number] Procedure:             Colonoscopy Indications:           Screening for colorectal malignant neoplasm Providers:             Andrey Farmer MD, MD Medicines:             Monitored Anesthesia Care Complications:         No immediate complications. Procedure:             Pre-Anesthesia Assessment:                        - Prior to the procedure, a History and Physical was                         performed, and patient medications and allergies were                         reviewed. The patient is competent. The risks and                         benefits of the procedure and the sedation options and                         risks were discussed with the patient. All questions                         were answered and informed consent was obtained.                         Patient identification and proposed procedure were                         verified by the physician, the nurse, the                         anesthesiologist, the anesthetist and the technician                         in the endoscopy suite. Mental Status Examination:                         alert and oriented. Airway Examination: normal                         oropharyngeal airway and neck mobility. Respiratory                         Examination: clear to auscultation. CV Examination:                         normal. Prophylactic Antibiotics: The patient does not                         require prophylactic antibiotics. Prior  Anticoagulants: The patient has taken no anticoagulant                         or antiplatelet agents. ASA Grade Assessment: II - A                         patient with mild  systemic disease. After reviewing                         the risks and benefits, the patient was deemed in                         satisfactory condition to undergo the procedure. The                         anesthesia plan was to use monitored anesthesia care                         (MAC). Immediately prior to administration of                         medications, the patient was re-assessed for adequacy                         to receive sedatives. The heart rate, respiratory                         rate, oxygen saturations, blood pressure, adequacy of                         pulmonary ventilation, and response to care were                         monitored throughout the procedure. The physical                         status of the patient was re-assessed after the                         procedure.                        After obtaining informed consent, the colonoscope was                         passed under direct vision. Throughout the procedure,                         the patient's blood pressure, pulse, and oxygen                         saturations were monitored continuously. The                         Colonoscope was introduced through the anus and                         advanced to the the cecum, identified by appendiceal  orifice and ileocecal valve. The colonoscopy was                         performed without difficulty. The patient tolerated                         the procedure well. The quality of the bowel                         preparation was good. The ileocecal valve, appendiceal                         orifice, and rectum were photographed. Findings:      The perianal and digital rectal examinations were normal.      Multiple small-mouthed diverticula were found in the sigmoid colon and       descending colon.      Internal hemorrhoids were found during retroflexion. The hemorrhoids       were Grade I (internal hemorrhoids that do not  prolapse).      The exam was otherwise without abnormality on direct and retroflexion       views. Impression:            - Diverticulosis in the sigmoid colon and in the                         descending colon.                        - Internal hemorrhoids.                        - The examination was otherwise normal on direct and                         retroflexion views.                        - No specimens collected. Recommendation:        - Discharge patient to home.                        - Resume previous diet.                        - Continue present medications.                        - Repeat colonoscopy is not recommended due to current                         age (91 years or older) for screening purposes.                        - Return to referring physician as previously                         scheduled. Procedure Code(s):     --- Professional ---                        J5009, Colorectal cancer screening; colonoscopy on  individual not meeting criteria for high risk Diagnosis Code(s):     --- Professional ---                        Z12.11, Encounter for screening for malignant neoplasm                         of colon                        K64.0, First degree hemorrhoids                        K57.30, Diverticulosis of large intestine without                         perforation or abscess without bleeding CPT copyright 2022 American Medical Association. All rights reserved. The codes documented in this report are preliminary and upon coder review may  be revised to meet current compliance requirements. Andrey Farmer MD, MD 05/10/2022 10:48:30 AM Number of Addenda: 0 Note Initiated On: 05/10/2022 10:18 AM Scope Withdrawal Time: 0 hours 9 minutes 39 seconds  Total Procedure Duration: 0 hours 13 minutes 6 seconds  Estimated Blood Loss:  Estimated blood loss: none.      [facility]

## 2022-05-10 NOTE — Anesthesia Procedure Notes (Signed)
Date/Time: 05/10/2022 10:26 AM  Performed by: Johnna Acosta, CRNAPre-anesthesia Checklist: Patient identified, Emergency Drugs available, Suction available, Patient being monitored and Timeout performed Patient Re-evaluated:Patient Re-evaluated prior to induction Oxygen Delivery Method: Nasal cannula Preoxygenation: Pre-oxygenation with 100% oxygen Induction Type: IV induction

## 2022-05-11 ENCOUNTER — Encounter: Payer: Self-pay | Admitting: Gastroenterology

## 2022-05-14 DIAGNOSIS — E7849 Other hyperlipidemia: Secondary | ICD-10-CM | POA: Diagnosis not present

## 2022-05-14 DIAGNOSIS — I10 Essential (primary) hypertension: Secondary | ICD-10-CM | POA: Diagnosis not present

## 2022-05-14 DIAGNOSIS — R7303 Prediabetes: Secondary | ICD-10-CM | POA: Diagnosis not present

## 2022-05-14 DIAGNOSIS — E876 Hypokalemia: Secondary | ICD-10-CM | POA: Diagnosis not present

## 2022-05-14 DIAGNOSIS — I480 Paroxysmal atrial fibrillation: Secondary | ICD-10-CM | POA: Diagnosis not present

## 2022-05-14 DIAGNOSIS — E538 Deficiency of other specified B group vitamins: Secondary | ICD-10-CM | POA: Diagnosis not present

## 2022-05-14 DIAGNOSIS — D72818 Other decreased white blood cell count: Secondary | ICD-10-CM | POA: Diagnosis not present

## 2022-05-14 DIAGNOSIS — I700 Atherosclerosis of aorta: Secondary | ICD-10-CM | POA: Diagnosis not present

## 2022-05-21 DIAGNOSIS — E876 Hypokalemia: Secondary | ICD-10-CM | POA: Diagnosis not present

## 2022-07-23 ENCOUNTER — Ambulatory Visit: Payer: PPO | Admitting: Podiatry

## 2022-07-28 ENCOUNTER — Ambulatory Visit: Payer: PPO | Admitting: Podiatry

## 2022-07-28 ENCOUNTER — Encounter: Payer: Self-pay | Admitting: Podiatry

## 2022-07-28 DIAGNOSIS — L600 Ingrowing nail: Secondary | ICD-10-CM

## 2022-07-28 NOTE — Progress Notes (Signed)
Subjective:   Patient ID: Anthony Saunders, male   DOB: 72 y.o.   MRN: [id number]   HPI Patient states she is got a chronic ingrown toenail both medial lateral left big toe and also has a spicule on the right big toe that sore.  States that both of these issues are a problem but the left one is worse than the right when he can handle despite trimming and he did have previous surgery on this number years ago.  Patient does not smoke likes to be active   Review of Systems  All other systems reviewed and are negative.       Objective:  Physical Exam Vitals and nursing note reviewed.  Constitutional:      Appearance: He is well-developed.  Pulmonary:     Effort: Pulmonary effort is normal.  Musculoskeletal:        General: Normal range of motion.  Skin:    General: Skin is warm.  Neurological:     Mental Status: He is alert.     Neurovascular status intact muscle strength found to be adequate range of motion adequate with patient noted to have incurvated left hallux medial lateral borders painful when pressed with patient having trouble wearing shoe gear associated with it.  Patient has good digital perfusion well-oriented x 3 with a spicule on the right hallux     Assessment:  Chronic ingrown toenail deformity left hallux medial lateral border with pain and spicule right hallux     Plan:  H&P reviewed both conditions and organ to focus on the left foot.  I went ahead today and I discussed correction explained procedure risk patient wants surgery and I infiltrated the left hallux 60 mg like Marcaine mixture sterile prep done using sterile instrumentation removed the medial lateral border left hallux and exposed matrix applied phenol 3 applications 30 seconds each border followed by alcohol lavage sterile dressing gave instructions on soaks reappoint and leave dressing on 24 hours take it off earlier if throbbing should occur

## 2022-07-28 NOTE — Patient Instructions (Signed)

## 2022-11-05 DIAGNOSIS — D72818 Other decreased white blood cell count: Secondary | ICD-10-CM | POA: Diagnosis not present

## 2022-11-05 DIAGNOSIS — E538 Deficiency of other specified B group vitamins: Secondary | ICD-10-CM | POA: Diagnosis not present

## 2022-11-05 DIAGNOSIS — E7849 Other hyperlipidemia: Secondary | ICD-10-CM | POA: Diagnosis not present

## 2022-11-05 DIAGNOSIS — I10 Essential (primary) hypertension: Secondary | ICD-10-CM | POA: Diagnosis not present

## 2022-11-05 DIAGNOSIS — R7303 Prediabetes: Secondary | ICD-10-CM | POA: Diagnosis not present

## 2022-11-12 DIAGNOSIS — I700 Atherosclerosis of aorta: Secondary | ICD-10-CM | POA: Diagnosis not present

## 2022-11-12 DIAGNOSIS — N200 Calculus of kidney: Secondary | ICD-10-CM | POA: Diagnosis not present

## 2022-11-12 DIAGNOSIS — I480 Paroxysmal atrial fibrillation: Secondary | ICD-10-CM | POA: Diagnosis not present

## 2022-11-12 DIAGNOSIS — R7303 Prediabetes: Secondary | ICD-10-CM | POA: Diagnosis not present

## 2022-11-12 DIAGNOSIS — E538 Deficiency of other specified B group vitamins: Secondary | ICD-10-CM | POA: Diagnosis not present

## 2022-11-12 DIAGNOSIS — D72818 Other decreased white blood cell count: Secondary | ICD-10-CM | POA: Diagnosis not present

## 2022-11-12 DIAGNOSIS — E7849 Other hyperlipidemia: Secondary | ICD-10-CM | POA: Diagnosis not present

## 2022-11-12 DIAGNOSIS — I10 Essential (primary) hypertension: Secondary | ICD-10-CM | POA: Diagnosis not present

## 2022-11-26 IMAGING — DX DG CHEST 1V PORT
1 series · 1 of 1 positions shown · non-contrast
Comparison: 05/28/2015

CLINICAL DATA: Recent syncopal episode

EXAM:
PORTABLE CHEST 1 VIEW

[chest ap]
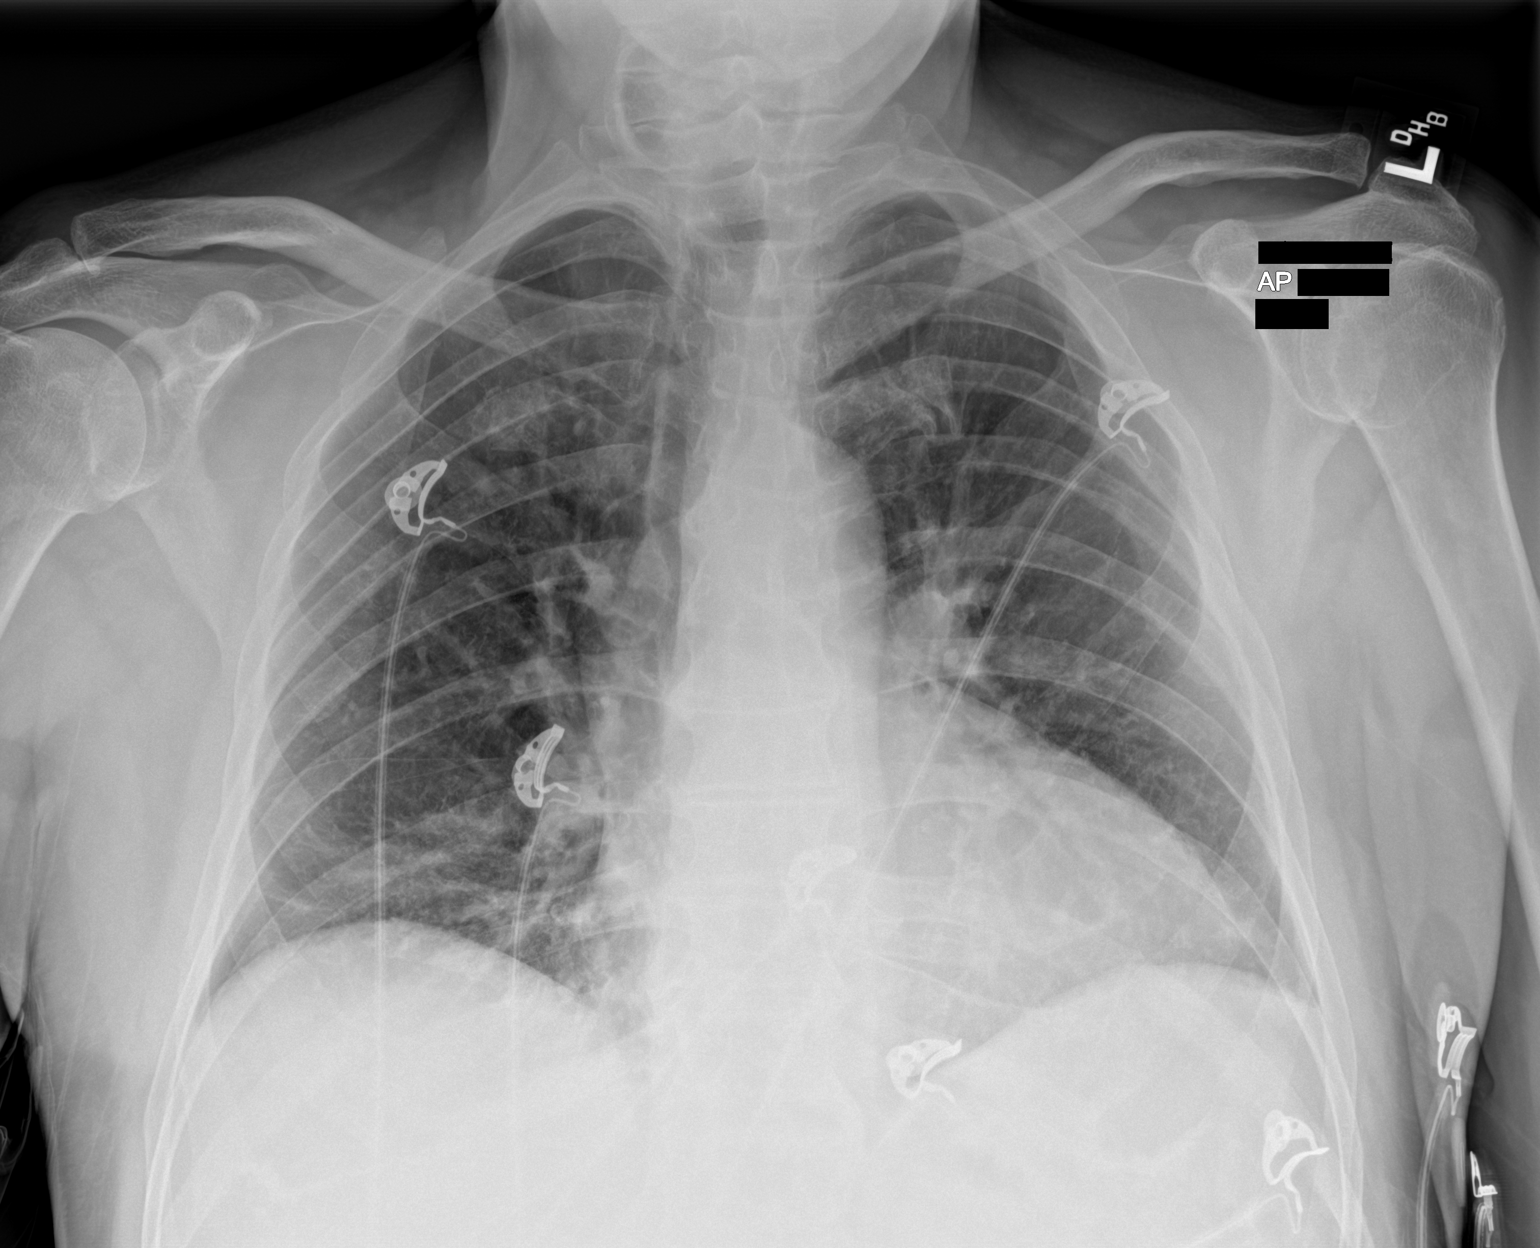

[1 of 1 positions shown; findings below may reference images not displayed]

FINDINGS: Cardiac shadow is mildly prominent but accentuated by the portable
technique. Mild right basilar atelectasis is seen. No bony
abnormality is noted.
IMPRESSION: Mild right basilar atelectasis.

## 2022-12-21 IMAGING — CT CT HEAD W/O CM
3 of 4 series · 13 of 47 positions shown, 15 images · non-contrast
Comparison: None.

CLINICAL DATA: Syncopal episode [REDACTED], headaches

EXAM:
CT HEAD WITHOUT CONTRAST
TECHNIQUE: Contiguous axial images were obtained from the base of the skull
through the vertex without intravenous contrast.

[Series 2: axial st head 5.00 ax · axial · 0.33mm/px · z∈[-479,-385]mm · 7 of 27 slices shown, 9 images]
[im 4/27  brain]
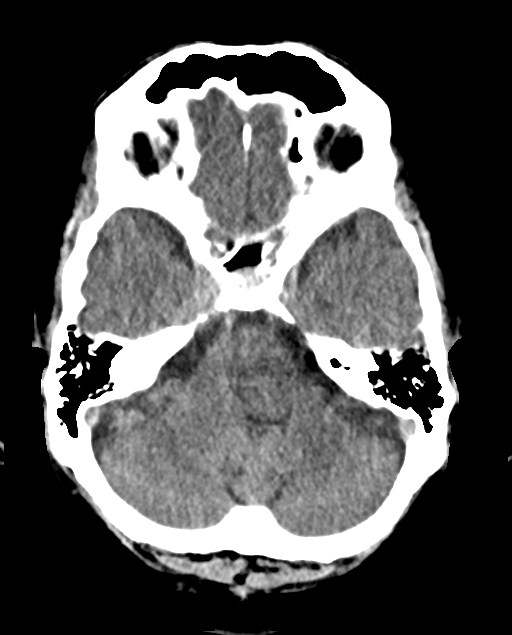
[im 4/27  bone]
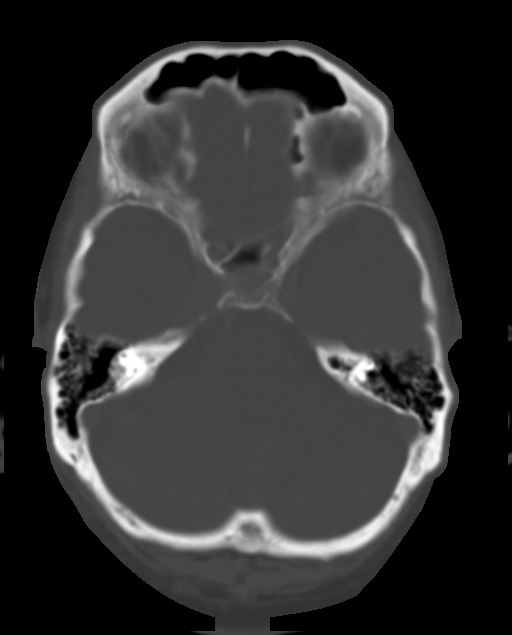
[im 7/27  brain]
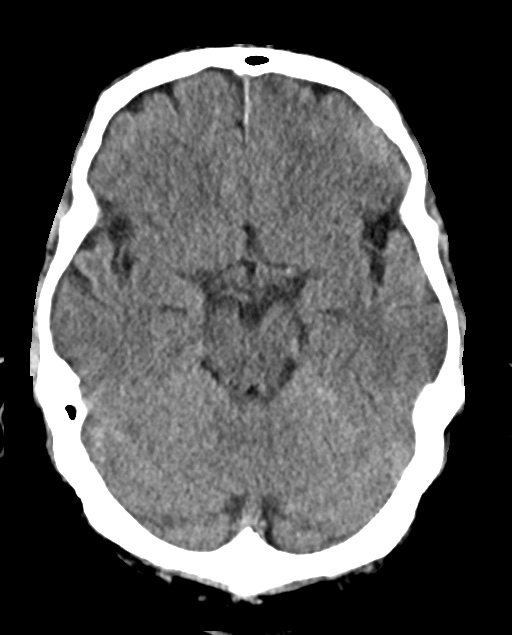
[im 10/27  brain]
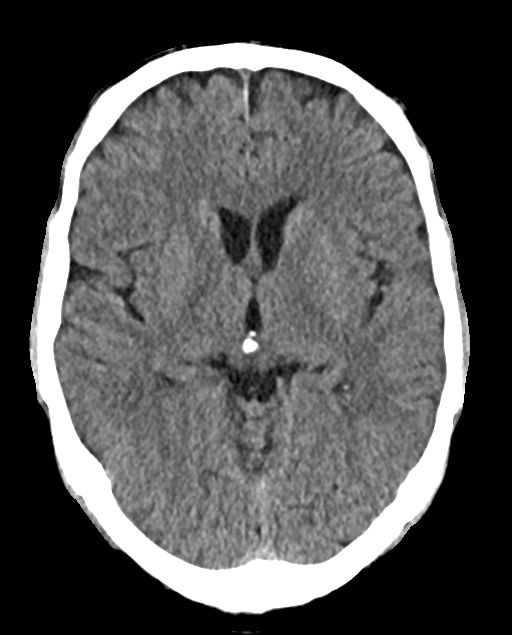
[im 14/27  brain]
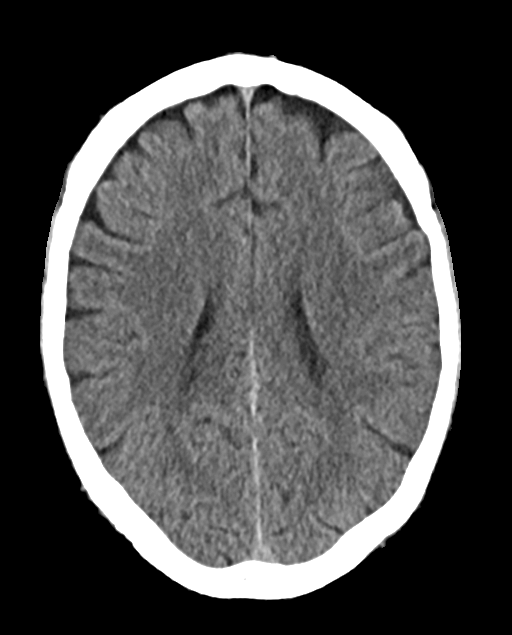
[im 17/27  brain]
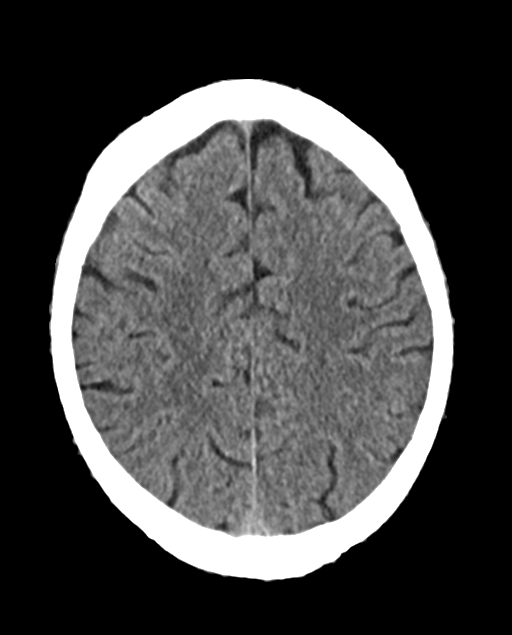
[im 17/27  bone]
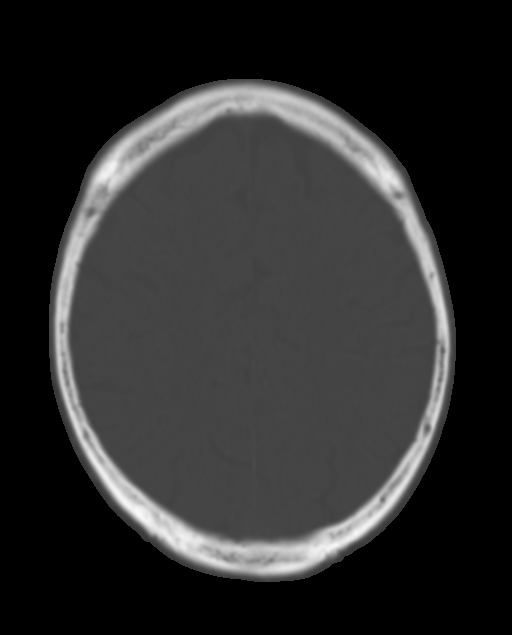
[im 20/27  brain]
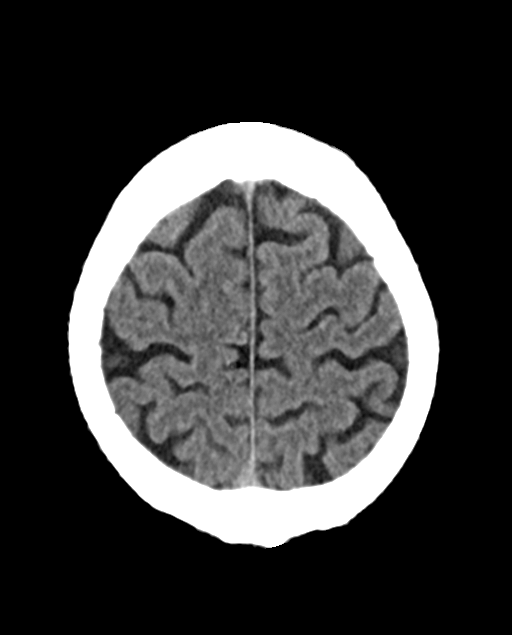
[im 23/27  brain]
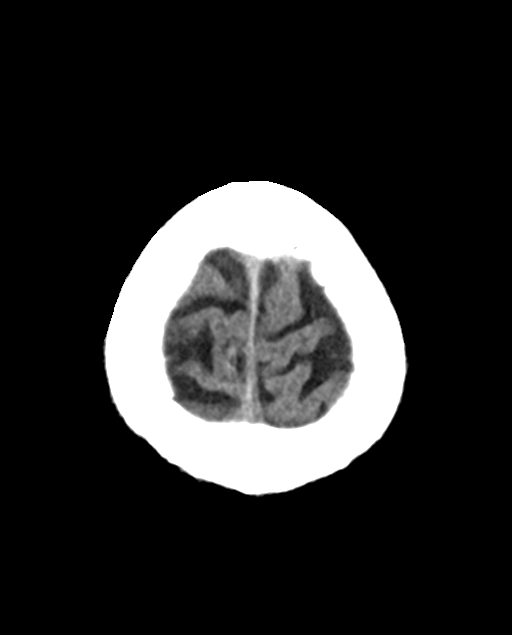

[Series 6: coronals head 3.00 cor · coronal · 0.27mm/px · 3 of 72 slices shown]
[im 24/72  brain]
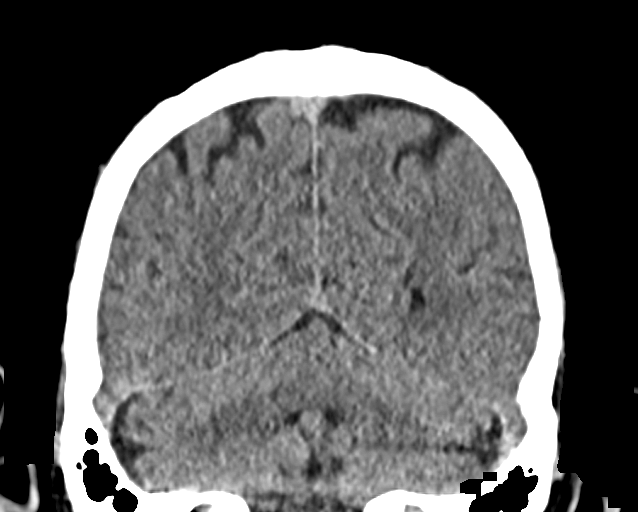
[im 32/72  brain]
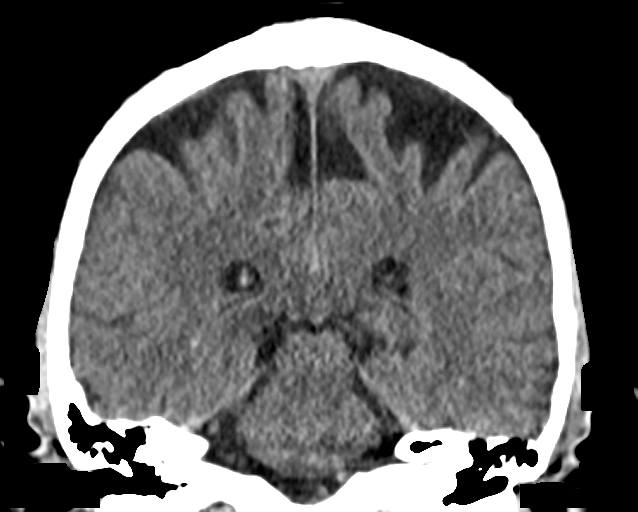
[im 40/72  brain]
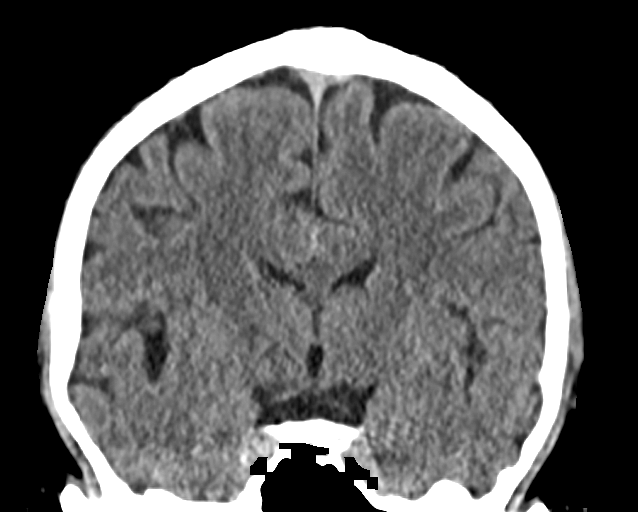

[Series 8: sagittals head 3.00 sag · sagittal · 0.27mm/px · 3 of 57 slices shown]
[im 19/57  brain]
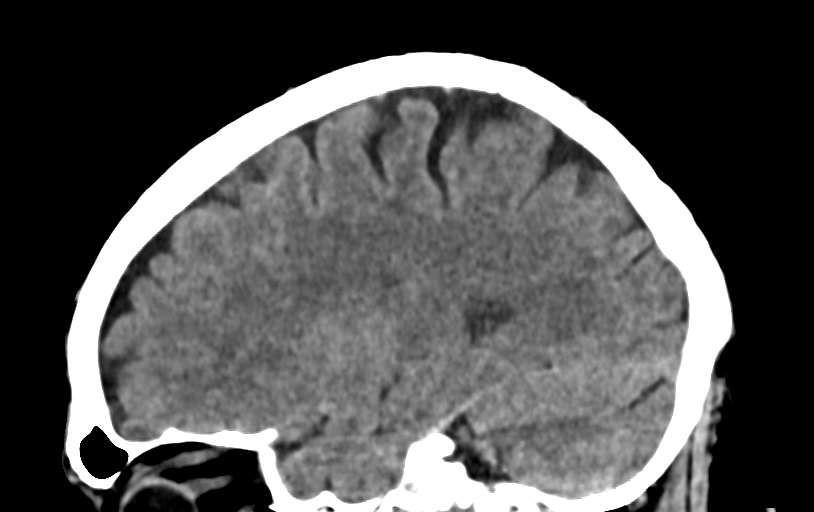
[im 29/57  brain]
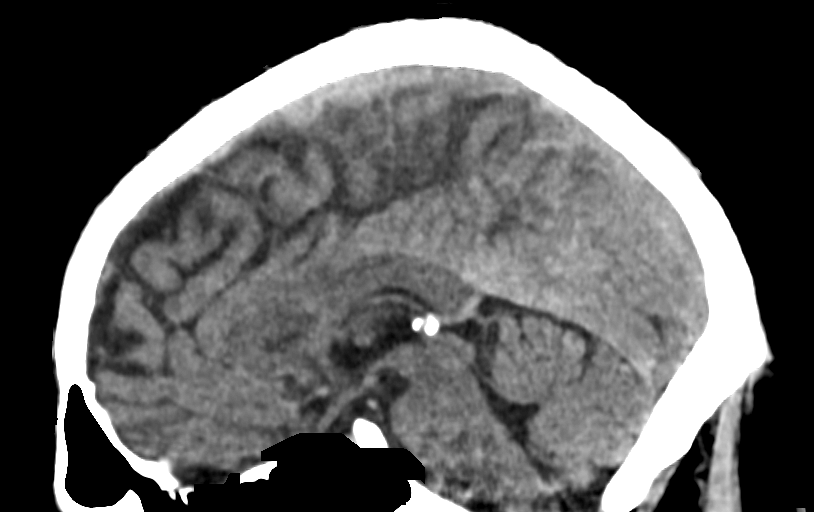
[im 38/57  brain]
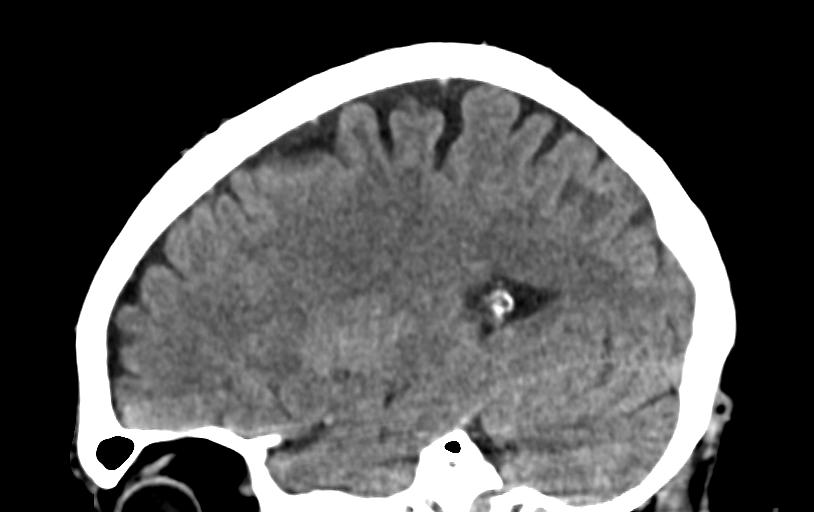

[13 of 47 positions shown; findings below may reference images not displayed]

FINDINGS: Brain: There is no acute intracranial hemorrhage, mass effect, or
edema. Gray-white differentiation is preserved. There is no
extra-axial fluid collection. Ventricles and sulci are within normal
limits in size and configuration.

Vascular: No hyperdense vessel or unexpected calcification.

Skull: Calvarium is unremarkable.

Sinuses/Orbits: No acute finding.

Other: None.
IMPRESSION: No acute or significant intracranial abnormality.

## 2023-01-25 DIAGNOSIS — E119 Type 2 diabetes mellitus without complications: Secondary | ICD-10-CM | POA: Diagnosis not present

## 2023-01-25 DIAGNOSIS — D3131 Benign neoplasm of right choroid: Secondary | ICD-10-CM | POA: Diagnosis not present

## 2023-01-25 DIAGNOSIS — H2512 Age-related nuclear cataract, left eye: Secondary | ICD-10-CM | POA: Diagnosis not present

## 2023-01-25 DIAGNOSIS — H2511 Age-related nuclear cataract, right eye: Secondary | ICD-10-CM | POA: Diagnosis not present

## 2023-01-26 ENCOUNTER — Ambulatory Visit: Payer: PPO | Admitting: Dermatology

## 2023-02-14 ENCOUNTER — Ambulatory Visit: Payer: PPO | Admitting: Dermatology

## 2023-02-14 DIAGNOSIS — L821 Other seborrheic keratosis: Secondary | ICD-10-CM | POA: Diagnosis not present

## 2023-02-14 DIAGNOSIS — Z85828 Personal history of other malignant neoplasm of skin: Secondary | ICD-10-CM

## 2023-02-14 DIAGNOSIS — L570 Actinic keratosis: Secondary | ICD-10-CM | POA: Diagnosis not present

## 2023-02-14 DIAGNOSIS — L814 Other melanin hyperpigmentation: Secondary | ICD-10-CM | POA: Diagnosis not present

## 2023-02-14 DIAGNOSIS — D229 Melanocytic nevi, unspecified: Secondary | ICD-10-CM

## 2023-02-14 DIAGNOSIS — W908XXA Exposure to other nonionizing radiation, initial encounter: Secondary | ICD-10-CM | POA: Diagnosis not present

## 2023-02-14 DIAGNOSIS — D1801 Hemangioma of skin and subcutaneous tissue: Secondary | ICD-10-CM

## 2023-02-14 DIAGNOSIS — Z1283 Encounter for screening for malignant neoplasm of skin: Secondary | ICD-10-CM

## 2023-02-14 DIAGNOSIS — L578 Other skin changes due to chronic exposure to nonionizing radiation: Secondary | ICD-10-CM | POA: Diagnosis not present

## 2023-02-14 NOTE — Patient Instructions (Addendum)

## 2023-02-14 NOTE — Progress Notes (Unsigned)
Follow-Up Visit   Subjective  Anthony Saunders is a 72 y.o. male who presents for the following: Skin Cancer Screening and Full Body Skin Exam, hx of BCC  The patient presents for Total-Body Skin Exam (TBSE) for skin cancer screening and mole check. The patient has spots, moles and lesions to be evaluated, some may be new or changing and the patient may have concern these could be cancer.  The following portions of the chart were reviewed this encounter and updated as appropriate: medications, allergies, medical history  Review of Systems:  No other skin or systemic complaints except as noted in HPI or Assessment and Plan.  Objective  Well appearing patient in no apparent distress; mood and affect are within normal limits.  A full examination was performed including scalp, head, eyes, ears, nose, lips, neck, chest, axillae, abdomen, back, buttocks, bilateral upper extremities, bilateral lower extremities, hands, feet, fingers, toes, fingernails, and toenails. All findings within normal limits unless otherwise noted below.   Relevant physical exam findings are noted in the Assessment and Plan.  scalp, face, right ear x 3 (3) Erythematous thin papules/macules with gritty scale.    Assessment & Plan   SKIN CANCER SCREENING PERFORMED TODAY.   LENTIGINES, SEBORRHEIC KERATOSES, HEMANGIOMAS - Benign normal skin lesions - Benign-appearing - Call for any changes  MELANOCYTIC NEVI - Tan-brown and/or pink-flesh-colored symmetric macules and papules - Benign appearing on exam today - Observation - Call clinic for new or changing moles - Recommend daily use of broad spectrum spf 30+ sunscreen to sun-exposed areas.   AK (actinic keratosis) (3) scalp, face, right ear x 3  Actinic keratoses are precancerous spots that appear secondary to cumulative UV radiation exposure/sun exposure over time. They are chronic with expected duration over 1 year. A portion of actinic keratoses will progress  to squamous cell carcinoma of the skin. It is not possible to reliably predict which spots will progress to skin cancer and so treatment is recommended to prevent development of skin cancer.  Recommend daily broad spectrum sunscreen SPF 30+ to sun-exposed areas, reapply every 2 hours as needed.  Recommend staying in the shade or wearing long sleeves, sun glasses (UVA+UVB protection) and wide brim hats (4-inch brim around the entire circumference of the hat). Call for new or changing lesions.   Destruction of lesion - scalp, face, right ear x 3 (3) Complexity: simple   Destruction method: cryotherapy   Informed consent: discussed and consent obtained   Timeout:  patient name, date of birth, surgical site, and procedure verified Lesion destroyed using liquid nitrogen: Yes   Region frozen until ice ball extended beyond lesion: Yes   Outcome: patient tolerated procedure well with no complications   Post-procedure details: wound care instructions given    ACTINIC DAMAGE - Chronic condition, secondary to cumulative UV/sun exposure - diffuse scaly erythematous macules with underlying dyspigmentation - Recommend daily broad spectrum sunscreen SPF 30+ to sun-exposed areas, reapply every 2 hours as needed.  - Staying in the shade or wearing long sleeves, sun glasses (UVA+UVB protection) and wide brim hats (4-inch brim around the entire circumference of the hat) are also recommended for sun protection.  - Call for new or changing lesions.  HISTORY OF BASAL CELL CARCINOMA OF THE SKIN - No evidence of recurrence today - Recommend regular full body skin exams - Recommend daily broad spectrum sunscreen SPF 30+ to sun-exposed areas, reapply every 2 hours as needed.  - Call if any new or changing lesions  are noted between office visits   Return in about 1 year (around 02/14/2024) for TBSE, hx of BCC.  IAngelique Holm, CMA, am acting as scribe for Armida Sans, MD .   Documentation: I have reviewed  the above documentation for accuracy and completeness, and I agree with the above.  Armida Sans, MD

## 2023-02-16 ENCOUNTER — Encounter: Payer: Self-pay | Admitting: Dermatology

## 2023-05-16 DIAGNOSIS — R7303 Prediabetes: Secondary | ICD-10-CM | POA: Diagnosis not present

## 2023-05-16 DIAGNOSIS — E538 Deficiency of other specified B group vitamins: Secondary | ICD-10-CM | POA: Diagnosis not present

## 2023-05-16 DIAGNOSIS — E7849 Other hyperlipidemia: Secondary | ICD-10-CM | POA: Diagnosis not present

## 2023-05-23 DIAGNOSIS — I10 Essential (primary) hypertension: Secondary | ICD-10-CM | POA: Diagnosis not present

## 2023-05-23 DIAGNOSIS — R7303 Prediabetes: Secondary | ICD-10-CM | POA: Diagnosis not present

## 2023-05-23 DIAGNOSIS — E7849 Other hyperlipidemia: Secondary | ICD-10-CM | POA: Diagnosis not present

## 2023-05-23 DIAGNOSIS — N200 Calculus of kidney: Secondary | ICD-10-CM | POA: Diagnosis not present

## 2023-05-23 DIAGNOSIS — Z0000 Encounter for general adult medical examination without abnormal findings: Secondary | ICD-10-CM | POA: Diagnosis not present

## 2023-05-23 DIAGNOSIS — E538 Deficiency of other specified B group vitamins: Secondary | ICD-10-CM | POA: Diagnosis not present

## 2023-05-23 DIAGNOSIS — D72818 Other decreased white blood cell count: Secondary | ICD-10-CM | POA: Diagnosis not present

## 2023-05-23 DIAGNOSIS — I700 Atherosclerosis of aorta: Secondary | ICD-10-CM | POA: Diagnosis not present

## 2023-05-23 DIAGNOSIS — I480 Paroxysmal atrial fibrillation: Secondary | ICD-10-CM | POA: Diagnosis not present

## 2023-09-27 ENCOUNTER — Encounter: Payer: Self-pay | Admitting: Dermatology

## 2023-09-27 ENCOUNTER — Ambulatory Visit: Admitting: Dermatology

## 2023-09-27 DIAGNOSIS — Z85828 Personal history of other malignant neoplasm of skin: Secondary | ICD-10-CM | POA: Diagnosis not present

## 2023-09-27 DIAGNOSIS — L720 Epidermal cyst: Secondary | ICD-10-CM

## 2023-09-27 NOTE — Progress Notes (Signed)
   Follow-Up Visit   Subjective  Anthony Saunders is a 73 y.o. male who presents for the following: spot at left chest, came up a few weeks ago. No itching, no soreness. Patient does have hx of BCC, normally sees Anthony Saunders.   The patient has spots, moles and lesions to be evaluated, some may be new or changing and the patient may have concern these could be cancer.   The following portions of the chart were reviewed this encounter and updated as appropriate: medications, allergies, medical history  Review of Systems:  No other skin or systemic complaints except as noted in HPI or Assessment and Plan.  Objective  Well appearing patient in no apparent distress; mood and affect are within normal limits.   A focused examination was performed of the following areas: chest  Relevant exam findings are noted in the Assessment and Plan.    Assessment & Plan     EPIDERMAL INCLUSION CYST Exam: Subcutaneous doughy nodule with ostium at left chest  Benign-appearing. Exam most consistent with an epidermal inclusion cyst. Discussed that a cyst is a benign growth that can grow over time and sometimes get irritated or inflamed. Recommend observation if it is not bothersome. Discussed option of surgical excision to remove it if it is growing, symptomatic, or other changes noted. Please call for new or changing lesions so they can be evaluated.    EIC (EPIDERMAL INCLUSION CYST)    Return for TBSE, as scheduled, with Anthony Saunders, RMA, am acting as scribe for Elie Goody, MD .   Documentation: I have reviewed the above documentation for accuracy and completeness, and I agree with the above.  Elie Goody, MD

## 2023-09-27 NOTE — Patient Instructions (Signed)

## 2023-11-15 DIAGNOSIS — E538 Deficiency of other specified B group vitamins: Secondary | ICD-10-CM | POA: Diagnosis not present

## 2023-11-15 DIAGNOSIS — R7303 Prediabetes: Secondary | ICD-10-CM | POA: Diagnosis not present

## 2023-11-15 DIAGNOSIS — I10 Essential (primary) hypertension: Secondary | ICD-10-CM | POA: Diagnosis not present

## 2023-11-15 DIAGNOSIS — E7849 Other hyperlipidemia: Secondary | ICD-10-CM | POA: Diagnosis not present

## 2023-11-22 DIAGNOSIS — I480 Paroxysmal atrial fibrillation: Secondary | ICD-10-CM | POA: Diagnosis not present

## 2023-11-22 DIAGNOSIS — E538 Deficiency of other specified B group vitamins: Secondary | ICD-10-CM | POA: Diagnosis not present

## 2023-11-22 DIAGNOSIS — R7303 Prediabetes: Secondary | ICD-10-CM | POA: Diagnosis not present

## 2023-11-22 DIAGNOSIS — D72818 Other decreased white blood cell count: Secondary | ICD-10-CM | POA: Diagnosis not present

## 2023-11-22 DIAGNOSIS — E7849 Other hyperlipidemia: Secondary | ICD-10-CM | POA: Diagnosis not present

## 2023-11-22 DIAGNOSIS — I10 Essential (primary) hypertension: Secondary | ICD-10-CM | POA: Diagnosis not present

## 2023-11-22 DIAGNOSIS — I700 Atherosclerosis of aorta: Secondary | ICD-10-CM | POA: Diagnosis not present

## 2023-11-22 DIAGNOSIS — N200 Calculus of kidney: Secondary | ICD-10-CM | POA: Diagnosis not present

## 2024-01-31 DIAGNOSIS — H35372 Puckering of macula, left eye: Secondary | ICD-10-CM | POA: Diagnosis not present

## 2024-01-31 DIAGNOSIS — D3131 Benign neoplasm of right choroid: Secondary | ICD-10-CM | POA: Diagnosis not present

## 2024-01-31 DIAGNOSIS — H2513 Age-related nuclear cataract, bilateral: Secondary | ICD-10-CM | POA: Diagnosis not present

## 2024-01-31 DIAGNOSIS — E119 Type 2 diabetes mellitus without complications: Secondary | ICD-10-CM | POA: Diagnosis not present

## 2024-02-14 DIAGNOSIS — H2513 Age-related nuclear cataract, bilateral: Secondary | ICD-10-CM | POA: Diagnosis not present

## 2024-02-14 DIAGNOSIS — H35372 Puckering of macula, left eye: Secondary | ICD-10-CM | POA: Diagnosis not present

## 2024-02-14 DIAGNOSIS — D3131 Benign neoplasm of right choroid: Secondary | ICD-10-CM | POA: Diagnosis not present

## 2024-02-22 ENCOUNTER — Ambulatory Visit: Admitting: Podiatry

## 2024-02-22 ENCOUNTER — Ambulatory Visit (INDEPENDENT_AMBULATORY_CARE_PROVIDER_SITE_OTHER)

## 2024-02-22 ENCOUNTER — Encounter: Payer: Self-pay | Admitting: Podiatry

## 2024-02-22 DIAGNOSIS — R202 Paresthesia of skin: Secondary | ICD-10-CM

## 2024-02-22 DIAGNOSIS — R200 Anesthesia of skin: Secondary | ICD-10-CM

## 2024-02-22 MED ORDER — GABAPENTIN 100 MG PO CAPS: 100.0000 mg | ORAL_CAPSULE | Freq: Three times a day (TID) | ORAL | 3 refills | Status: AC

## 2024-02-22 NOTE — Progress Notes (Signed)
 Subjective:   Patient ID: Anthony Saunders, male   DOB: 73 y.o.   MRN: [id number]   HPI Patient presents stating that he is developing numbness in both his feet and it has been around a year and a half.  Patient states that he does not remember injury and that overall his health has been good and that it just seems to bother him more during the day and at nighttime is periodically   ROS      Objective:  Physical Exam  Neurovascular status was intact when I checked fully his neurological sensation and its normal.  He does have some tingling in his feet extending to the midfoot and heels and forefoot and worse with ambulation     Assessment:  Probability for idiopathic neuropathy bilateral with possibility of back issues or other pathology     Plan:  H&P conditions reviewed and at this point I went ahead and I placed him on low-dose 100 mg gabapentin  to start at night and if he tolerates that he can do 1 in the morning 1 midday and will be seen back to recheck  X-rays were negative that this appears to be an arthritic issue what appears to be other bony structural pathology

## 2024-03-01 ENCOUNTER — Ambulatory Visit: Payer: PPO | Admitting: Dermatology

## 2024-03-08 ENCOUNTER — Ambulatory Visit: Admitting: Dermatology

## 2024-03-20 ENCOUNTER — Ambulatory Visit: Admitting: Dermatology

## 2024-03-20 ENCOUNTER — Encounter: Payer: Self-pay | Admitting: Dermatology

## 2024-03-20 DIAGNOSIS — L821 Other seborrheic keratosis: Secondary | ICD-10-CM

## 2024-03-20 DIAGNOSIS — L578 Other skin changes due to chronic exposure to nonionizing radiation: Secondary | ICD-10-CM

## 2024-03-20 DIAGNOSIS — L729 Follicular cyst of the skin and subcutaneous tissue, unspecified: Secondary | ICD-10-CM

## 2024-03-20 DIAGNOSIS — L820 Inflamed seborrheic keratosis: Secondary | ICD-10-CM | POA: Diagnosis not present

## 2024-03-20 DIAGNOSIS — D1801 Hemangioma of skin and subcutaneous tissue: Secondary | ICD-10-CM

## 2024-03-20 DIAGNOSIS — D229 Melanocytic nevi, unspecified: Secondary | ICD-10-CM

## 2024-03-20 DIAGNOSIS — L814 Other melanin hyperpigmentation: Secondary | ICD-10-CM

## 2024-03-20 DIAGNOSIS — Z1283 Encounter for screening for malignant neoplasm of skin: Secondary | ICD-10-CM | POA: Diagnosis not present

## 2024-03-20 DIAGNOSIS — L720 Epidermal cyst: Secondary | ICD-10-CM | POA: Diagnosis not present

## 2024-03-20 DIAGNOSIS — W908XXA Exposure to other nonionizing radiation, initial encounter: Secondary | ICD-10-CM | POA: Diagnosis not present

## 2024-03-20 DIAGNOSIS — Z85828 Personal history of other malignant neoplasm of skin: Secondary | ICD-10-CM

## 2024-03-20 DIAGNOSIS — L570 Actinic keratosis: Secondary | ICD-10-CM

## 2024-03-20 NOTE — Patient Instructions (Addendum)

## 2024-03-20 NOTE — Progress Notes (Signed)
 Follow-Up Visit   Subjective  Anthony Saunders is a 73 y.o. male who presents for the following: Skin Cancer Screening and Full Body Skin Exam, hx of BCCs, Aks, check rough spot L preauricular/sideburn, 72m, itchy, check spot L post auricular/neck  The patient presents for Total-Body Skin Exam (TBSE) for skin cancer screening and mole check. The patient has spots, moles and lesions to be evaluated, some may be new or changing and the patient may have concern these could be cancer.  The following portions of the chart were reviewed this encounter and updated as appropriate: medications, allergies, medical history  Review of Systems:  No other skin or systemic complaints except as noted in HPI or Assessment and Plan.  Objective  Well appearing patient in no apparent distress; mood and affect are within normal limits.  A full examination was performed including scalp, head, eyes, ears, nose, lips, neck, chest, axillae, abdomen, back, buttocks, bilateral upper extremities, bilateral lower extremities, hands, feet, fingers, toes, fingernails, and toenails. All findings within normal limits unless otherwise noted below.   Relevant physical exam findings are noted in the Assessment and Plan.  face, ears x 7 (7) Pink scaly macules Back x 2, face, neck, scalp x 22 (24) Stuck on waxy paps with erythema  Assessment & Plan   SKIN CANCER SCREENING PERFORMED TODAY.  ACTINIC DAMAGE - Chronic condition, secondary to cumulative UV/sun exposure - diffuse scaly erythematous macules with underlying dyspigmentation - Recommend daily broad spectrum sunscreen SPF 30+ to sun-exposed areas, reapply every 2 hours as needed.  - Staying in the shade or wearing long sleeves, sun glasses (UVA+UVB protection) and wide brim hats (4-inch brim around the entire circumference of the hat) are also recommended for sun protection.  - Call for new or changing lesions.  LENTIGINES, SEBORRHEIC KERATOSES, HEMANGIOMAS -  Benign normal skin lesions - Benign-appearing - Call for any changes  MELANOCYTIC NEVI - Tan-brown and/or pink-flesh-colored symmetric macules and papules - Benign appearing on exam today - Observation - Call clinic for new or changing moles - Recommend daily use of broad spectrum spf 30+ sunscreen to sun-exposed areas.   HISTORY OF BASAL CELL CARCINOMA OF THE SKIN - No evidence of recurrence today - Recommend regular full body skin exams - Recommend daily broad spectrum sunscreen SPF 30+ to sun-exposed areas, reapply every 2 hours as needed.  - Call if any new or changing lesions are noted between office visits  - R and L ear txted in past  EPIDERMAL INCLUSION CYST L inf pectoral Exam: Subcutaneous nodule at L inf pectoral 1.0cm Benign-appearing. Exam most consistent with an epidermal inclusion cyst. Discussed that a cyst is a benign growth that can grow over time and sometimes get irritated or inflamed. Recommend observation if it is not bothersome. Discussed option of surgical excision to remove it if it is growing, symptomatic, or other changes noted. Please call for new or changing lesions so they can be evaluated.  AK (ACTINIC KERATOSIS) (7) face, ears x 7 (7) Actinic keratoses are precancerous spots that appear secondary to cumulative UV radiation exposure/sun exposure over time. They are chronic with expected duration over 1 year. A portion of actinic keratoses will progress to squamous cell carcinoma of the skin. It is not possible to reliably predict which spots will progress to skin cancer and so treatment is recommended to prevent development of skin cancer.  Recommend daily broad spectrum sunscreen SPF 30+ to sun-exposed areas, reapply every 2 hours as needed.  Recommend staying in the shade or wearing long sleeves, sun glasses (UVA+UVB protection) and wide brim hats (4-inch brim around the entire circumference of the hat). Call for new or changing lesions. Destruction of  lesion - face, ears x 7 (7) Complexity: simple   Destruction method: cryotherapy   Informed consent: discussed and consent obtained   Timeout:  patient name, date of birth, surgical site, and procedure verified Lesion destroyed using liquid nitrogen: Yes   Region frozen until ice ball extended beyond lesion: Yes   Outcome: patient tolerated procedure well with no complications   Post-procedure details: wound care instructions given    INFLAMED SEBORRHEIC KERATOSIS (24) Back x 2, face, neck, scalp x 22 (24) Symptomatic, irritating, patient would like treated. Destruction of lesion - Back x 2, face, neck, scalp x 22 (24) Complexity: simple   Destruction method: cryotherapy   Informed consent: discussed and consent obtained   Timeout:  patient name, date of birth, surgical site, and procedure verified Lesion destroyed using liquid nitrogen: Yes   Region frozen until ice ball extended beyond lesion: Yes   Outcome: patient tolerated procedure well with no complications   Post-procedure details: wound care instructions given     Return in about 1 year (around 03/20/2025) for TBSE, Hx of BCC, Hx of AKs.  I, Grayce Saunas, RMA, am acting as scribe for Alm Rhyme, MD .   Documentation: I have reviewed the above documentation for accuracy and completeness, and I agree with the above.  Alm Rhyme, MD

## 2024-05-08 DIAGNOSIS — G629 Polyneuropathy, unspecified: Secondary | ICD-10-CM | POA: Diagnosis not present

## 2024-05-08 DIAGNOSIS — L0291 Cutaneous abscess, unspecified: Secondary | ICD-10-CM | POA: Diagnosis not present

## 2024-05-15 ENCOUNTER — Ambulatory Visit: Admitting: Dermatology

## 2024-05-17 ENCOUNTER — Ambulatory Visit: Admitting: Dermatology

## 2024-05-17 DIAGNOSIS — L720 Epidermal cyst: Secondary | ICD-10-CM

## 2024-05-17 DIAGNOSIS — L03313 Cellulitis of chest wall: Secondary | ICD-10-CM | POA: Diagnosis not present

## 2024-05-17 DIAGNOSIS — Z7189 Other specified counseling: Secondary | ICD-10-CM

## 2024-05-17 DIAGNOSIS — Z79899 Other long term (current) drug therapy: Secondary | ICD-10-CM

## 2024-05-17 DIAGNOSIS — L723 Sebaceous cyst: Secondary | ICD-10-CM

## 2024-05-17 DIAGNOSIS — L98491 Non-pressure chronic ulcer of skin of other sites limited to breakdown of skin: Secondary | ICD-10-CM

## 2024-05-17 MED ORDER — MUPIROCIN 2 % EX OINT: TOPICAL_OINTMENT | CUTANEOUS | 3 refills | Status: AC

## 2024-05-17 MED ORDER — DOXYCYCLINE MONOHYDRATE 100 MG PO CAPS: 100.0000 mg | ORAL_CAPSULE | Freq: Two times a day (BID) | ORAL | 0 refills | Status: AC

## 2024-05-17 NOTE — Progress Notes (Addendum)
   Follow-Up Visit   Subjective  Anthony Saunders is a 73 y.o. male who presents for the following:  Reports history of cyst at left chest. Became inflamed and started bothering patient over 2 weeks ago.  He was seen by another doctor who prescribed him a weeks worth of doxycycline  100 mg to take twice daily. Just recently completed medication.   The following portions of the chart were reviewed this encounter and updated as appropriate: medications, allergies, medical history  Review of Systems:  No other skin or systemic complaints except as noted in HPI or Assessment and Plan.  Objective  Well appearing patient in no apparent distress; mood and affect are within normal limits.   A focused examination was performed of the following areas: Left chest   Relevant exam findings are noted in the Assessment and Plan.  left infrapectoral See photos   Assessment & Plan   INFLAMED EPIDERMOID CYST OF SKIN left infrapectoral Inflammed ruptured open draining cyst with CELLULITIS Mechanical debridement preformed today with Curette and forceps - 4 cm squared area treated.  Wound was then cleanses with Purycn, applied Mupirocin  2 % ointment , covered with non stick guaze and applied paper tape.  Patient instructed to start Doxycycline  100 mg capsule - take 1 cap bid with food and drink for 10 days  Doxycycline  should be taken with food to prevent nausea. Do not lay down for 30 minutes after taking. Be cautious with sun exposure and use good sun protection while on this medication. Pregnant women should not take this medication.   Start Mupirocin  2 % ointment - apply to wound qd/bid and cover with bandage until healed.  Patient also instructed when showering to use soap and press on wound with fingers daily doxycycline  (MONODOX ) 100 MG capsule - left infrapectoral Take 1 capsule (100 mg total) by mouth 2 (two) times daily for 10 days. Take with food and drink  mupirocin  ointment  (BACTROBAN ) 2 % - left infrapectoral Apply to wound at left chest daily and cover with bandage until healed NON-PRESSURE CHRONIC ULCER OF SKIN OF OTHER SITES LIMITED TO BREAKDOWN OF SKIN (HCC)   COUNSELING AND COORDINATION OF CARE   MEDICATION MANAGEMENT   CELLULITIS OF CHEST WALL    Return for keep followup in sept 2026.  IEleanor Blush, CMA, am acting as scribe for Alm Rhyme, MD.   Documentation: I have reviewed the above documentation for accuracy and completeness, and I agree with the above.  Alm Rhyme, MD

## 2024-05-17 NOTE — Patient Instructions (Signed)

## 2024-05-22 ENCOUNTER — Encounter: Payer: Self-pay | Admitting: Dermatology

## 2024-05-24 DIAGNOSIS — E7849 Other hyperlipidemia: Secondary | ICD-10-CM | POA: Diagnosis not present

## 2024-05-24 DIAGNOSIS — I10 Essential (primary) hypertension: Secondary | ICD-10-CM | POA: Diagnosis not present

## 2024-05-24 DIAGNOSIS — E538 Deficiency of other specified B group vitamins: Secondary | ICD-10-CM | POA: Diagnosis not present

## 2024-05-24 DIAGNOSIS — R7303 Prediabetes: Secondary | ICD-10-CM | POA: Diagnosis not present

## 2024-05-31 DIAGNOSIS — E7849 Other hyperlipidemia: Secondary | ICD-10-CM | POA: Diagnosis not present

## 2024-05-31 DIAGNOSIS — I700 Atherosclerosis of aorta: Secondary | ICD-10-CM | POA: Diagnosis not present

## 2024-05-31 DIAGNOSIS — E538 Deficiency of other specified B group vitamins: Secondary | ICD-10-CM | POA: Diagnosis not present

## 2024-05-31 DIAGNOSIS — E118 Type 2 diabetes mellitus with unspecified complications: Secondary | ICD-10-CM | POA: Diagnosis not present

## 2024-05-31 DIAGNOSIS — N200 Calculus of kidney: Secondary | ICD-10-CM | POA: Diagnosis not present

## 2024-05-31 DIAGNOSIS — D72818 Other decreased white blood cell count: Secondary | ICD-10-CM | POA: Diagnosis not present

## 2024-05-31 DIAGNOSIS — Z1331 Encounter for screening for depression: Secondary | ICD-10-CM | POA: Diagnosis not present

## 2024-05-31 DIAGNOSIS — Z0000 Encounter for general adult medical examination without abnormal findings: Secondary | ICD-10-CM | POA: Diagnosis not present

## 2024-05-31 DIAGNOSIS — I480 Paroxysmal atrial fibrillation: Secondary | ICD-10-CM | POA: Diagnosis not present

## 2024-05-31 DIAGNOSIS — I10 Essential (primary) hypertension: Secondary | ICD-10-CM | POA: Diagnosis not present

## 2024-07-10 ENCOUNTER — Encounter: Payer: Self-pay | Admitting: Dermatology

## 2024-07-10 ENCOUNTER — Ambulatory Visit: Admitting: Dermatology

## 2024-07-10 DIAGNOSIS — W908XXA Exposure to other nonionizing radiation, initial encounter: Secondary | ICD-10-CM | POA: Diagnosis not present

## 2024-07-10 DIAGNOSIS — L570 Actinic keratosis: Secondary | ICD-10-CM | POA: Diagnosis not present

## 2024-07-10 DIAGNOSIS — L720 Epidermal cyst: Secondary | ICD-10-CM

## 2024-07-10 NOTE — Patient Instructions (Addendum)

## 2024-07-10 NOTE — Progress Notes (Signed)
" ° °  Follow-Up Visit   Subjective  Anthony Saunders is a 73 y.o. male who presents for the following:  Patient has a scab on the top of his right ear appeared after last office visit with Dr. Hester, has appeared before and goes away. Has not had an injury in that area. Present for weeks, not itchy.    The following portions of the chart were reviewed this encounter and updated as appropriate: medications, allergies, medical history  Review of Systems:  No other skin or systemic complaints except as noted in HPI or Assessment and Plan.  Objective  Well appearing patient in no apparent distress; mood and affect are within normal limits.  A focused examination was performed of the following areas: R ear face scalp L chest  Relevant exam findings are noted in the Assessment and Plan.  Right superior helix x1, right posterior vertex x1, right zygoma x1, (3) Erythematous thin papules/macules with gritty scale.  Assessment & Plan   EPIDERMAL INCLUSION CYST s/p inflammatory flare and doxy Exam: healed scar and residual subcutaneous nodule at left infrapectoral   Benign-appearing. Exam most consistent with an epidermal inclusion cyst. Discussed that a cyst is a benign growth that can grow over time and sometimes get irritated or inflamed. Recommend observation if it is not bothersome. Discussed option of surgical excision to remove it if it is growing, symptomatic, or other changes noted. Please call for new or changing lesions so they can be evaluated.  AK (ACTINIC KERATOSIS) (3) Right superior helix x1, right posterior vertex x1, right zygoma x1, (3) Actinic keratoses are precancerous spots that appear secondary to cumulative UV radiation exposure/sun exposure over time. They are chronic with expected duration over 1 year. A portion of actinic keratoses will progress to squamous cell carcinoma of the skin. It is not possible to reliably predict which spots will progress to skin cancer and  so treatment is recommended to prevent development of skin cancer.   Advised patient if area treated with cryotherapy on the right superior helix is not gone return back to office for biopsy. - Destruction of lesion - Right superior helix x1, right posterior vertex x1, right zygoma x1, (3) Complexity: simple   Destruction method: cryotherapy   Informed consent: discussed and consent obtained   Timeout:  patient name, date of birth, surgical site, and procedure verified Lesion destroyed using liquid nitrogen: Yes   Region frozen until ice ball extended beyond lesion: Yes   Cryo cycles: 1 or 2. Outcome: patient tolerated procedure well with no complications   Post-procedure details: wound care instructions given     Return for w/ Dr. Claudene, As scheduled.  IAlmetta Nora, RMA, am acting as scribe for Boneta Claudene, MD .   Documentation: I have reviewed the above documentation for accuracy and completeness, and I agree with the above.  Boneta Claudene, MD    "

## 2025-03-26 ENCOUNTER — Ambulatory Visit: Admitting: Dermatology
